# Patient Record
Sex: Female | Born: 1955 | Race: White | Hispanic: No | Smoking: Never smoker
Health system: Southern US, Community
[De-identification: ages and names within clinical notes are randomized; demographics above are authoritative.]

## PROBLEM LIST (undated history)

## (undated) DIAGNOSIS — M1990 Unspecified osteoarthritis, unspecified site: Secondary | ICD-10-CM

## (undated) DIAGNOSIS — J449 Chronic obstructive pulmonary disease, unspecified: Secondary | ICD-10-CM

## (undated) DIAGNOSIS — R112 Nausea with vomiting, unspecified: Secondary | ICD-10-CM

## (undated) DIAGNOSIS — E119 Type 2 diabetes mellitus without complications: Secondary | ICD-10-CM

## (undated) DIAGNOSIS — Z9889 Other specified postprocedural states: Secondary | ICD-10-CM

## (undated) DIAGNOSIS — I499 Cardiac arrhythmia, unspecified: Secondary | ICD-10-CM

## (undated) DIAGNOSIS — D649 Anemia, unspecified: Secondary | ICD-10-CM

## (undated) DIAGNOSIS — K219 Gastro-esophageal reflux disease without esophagitis: Secondary | ICD-10-CM

## (undated) DIAGNOSIS — I10 Essential (primary) hypertension: Secondary | ICD-10-CM

## (undated) DIAGNOSIS — J189 Pneumonia, unspecified organism: Secondary | ICD-10-CM

## (undated) HISTORY — PX: BREAST SURGERY: SHX581

## (undated) HISTORY — PX: ABDOMINAL HYSTERECTOMY: SHX81

## (undated) HISTORY — DX: Type 2 diabetes mellitus without complications: E11.9

## (undated) HISTORY — DX: Essential (primary) hypertension: I10

## (undated) HISTORY — PX: KNEE ARTHROSCOPY: SUR90

## (undated) HISTORY — PX: RIGHT OOPHORECTOMY: SHX2359

## (undated) HISTORY — PX: EYE SURGERY: SHX253

## (undated) HISTORY — PX: TONSILLECTOMY: SUR1361

## (undated) HISTORY — PX: BREAST EXCISIONAL BIOPSY: SUR124

## (undated) HISTORY — PX: CARPAL TUNNEL RELEASE: SHX101

## (undated) HISTORY — PX: CHOLECYSTECTOMY: SHX55

## (undated) HISTORY — PX: BACK SURGERY: SHX140

---

## 2004-06-22 ENCOUNTER — Ambulatory Visit: Payer: Self-pay | Admitting: Internal Medicine

## 2004-08-31 ENCOUNTER — Ambulatory Visit: Payer: Self-pay | Admitting: Family Medicine

## 2005-03-02 ENCOUNTER — Ambulatory Visit: Payer: Self-pay

## 2005-03-08 ENCOUNTER — Ambulatory Visit: Payer: Self-pay

## 2006-01-21 ENCOUNTER — Ambulatory Visit: Payer: Self-pay | Admitting: Family Medicine

## 2006-06-20 ENCOUNTER — Ambulatory Visit (HOSPITAL_BASED_OUTPATIENT_CLINIC_OR_DEPARTMENT_OTHER): Admission: RE | Admit: 2006-06-20 | Discharge: 2006-06-20 | Payer: Self-pay | Admitting: Orthopedic Surgery

## 2006-11-21 ENCOUNTER — Ambulatory Visit: Payer: Self-pay | Admitting: Gastroenterology

## 2007-01-25 ENCOUNTER — Ambulatory Visit: Payer: Self-pay | Admitting: Obstetrics and Gynecology

## 2007-02-02 ENCOUNTER — Ambulatory Visit: Payer: Self-pay | Admitting: Unknown Physician Specialty

## 2007-02-04 ENCOUNTER — Ambulatory Visit: Payer: Self-pay | Admitting: Unknown Physician Specialty

## 2008-06-07 ENCOUNTER — Ambulatory Visit: Payer: Self-pay | Admitting: Family Medicine

## 2008-10-04 ENCOUNTER — Emergency Department: Payer: Self-pay | Admitting: Emergency Medicine

## 2009-04-16 ENCOUNTER — Ambulatory Visit: Payer: Self-pay | Admitting: Family Medicine

## 2010-04-21 ENCOUNTER — Ambulatory Visit: Payer: Self-pay | Admitting: Family Medicine

## 2011-04-27 ENCOUNTER — Ambulatory Visit: Payer: Self-pay | Admitting: Family Medicine

## 2012-05-02 ENCOUNTER — Ambulatory Visit: Payer: Self-pay | Admitting: Family Medicine

## 2012-05-03 ENCOUNTER — Ambulatory Visit: Payer: Self-pay | Admitting: Family Medicine

## 2012-05-18 ENCOUNTER — Ambulatory Visit: Payer: Self-pay | Admitting: Surgery

## 2012-05-22 LAB — PATHOLOGY REPORT

## 2012-08-08 ENCOUNTER — Ambulatory Visit: Payer: Self-pay | Admitting: General Practice

## 2012-09-29 LAB — LIPID PANEL
CHOLESTEROL: 164 mg/dL (ref 0–200)
HDL: 41 mg/dL (ref 35–70)
LDL Cholesterol: 89 mg/dL
LDL/HDL RATIO: 4
Triglycerides: 171 mg/dL — AB (ref 40–160)

## 2012-09-29 LAB — BASIC METABOLIC PANEL
BUN: 9 mg/dL (ref 4–21)
Creatinine: 0.5 mg/dL (ref 0.5–1.1)
GLUCOSE: 239 mg/dL
Potassium: 4.3 mmol/L (ref 3.4–5.3)
SODIUM: 137 mmol/L (ref 137–147)

## 2012-09-29 LAB — HEPATIC FUNCTION PANEL
ALT: 26 U/L (ref 7–35)
AST: 32 U/L (ref 13–35)
Alkaline Phosphatase: 77 U/L (ref 25–125)
Bilirubin, Total: 0.2 mg/dL

## 2012-09-29 LAB — TSH: TSH: 2.91 u[IU]/mL (ref 0.41–5.90)

## 2012-11-23 ENCOUNTER — Ambulatory Visit: Payer: Self-pay | Admitting: Surgery

## 2012-12-06 ENCOUNTER — Ambulatory Visit: Payer: Self-pay | Admitting: Gastroenterology

## 2012-12-07 LAB — PATHOLOGY REPORT

## 2013-05-15 IMAGING — CR US BIOPSY BREAST CORE VACUUM ASSIST
1 series · 1 of 1 positions shown · non-contrast
Comparison: none

REASON FOR EXAM: RT BRST MICROCALS
COMMENTS:

PROCEDURE:     MAM - MAM STEREOTACTIC VACUUM ASSIST R  - May 18, 2012  [DATE]
RESULT:     History: 56 -year-old female referred for stereotactic core
biopsy of a cluster of indeterminate microcalcifications in the right breast
noted on recent mammogram at [HOSPITAL].
TECHNIQUE: Right breast stereotactic vacuum-assisted core biopsy. Unilateral
digital mammography was performed. Specimen radiography of the core
specimens was performed.

[CC]
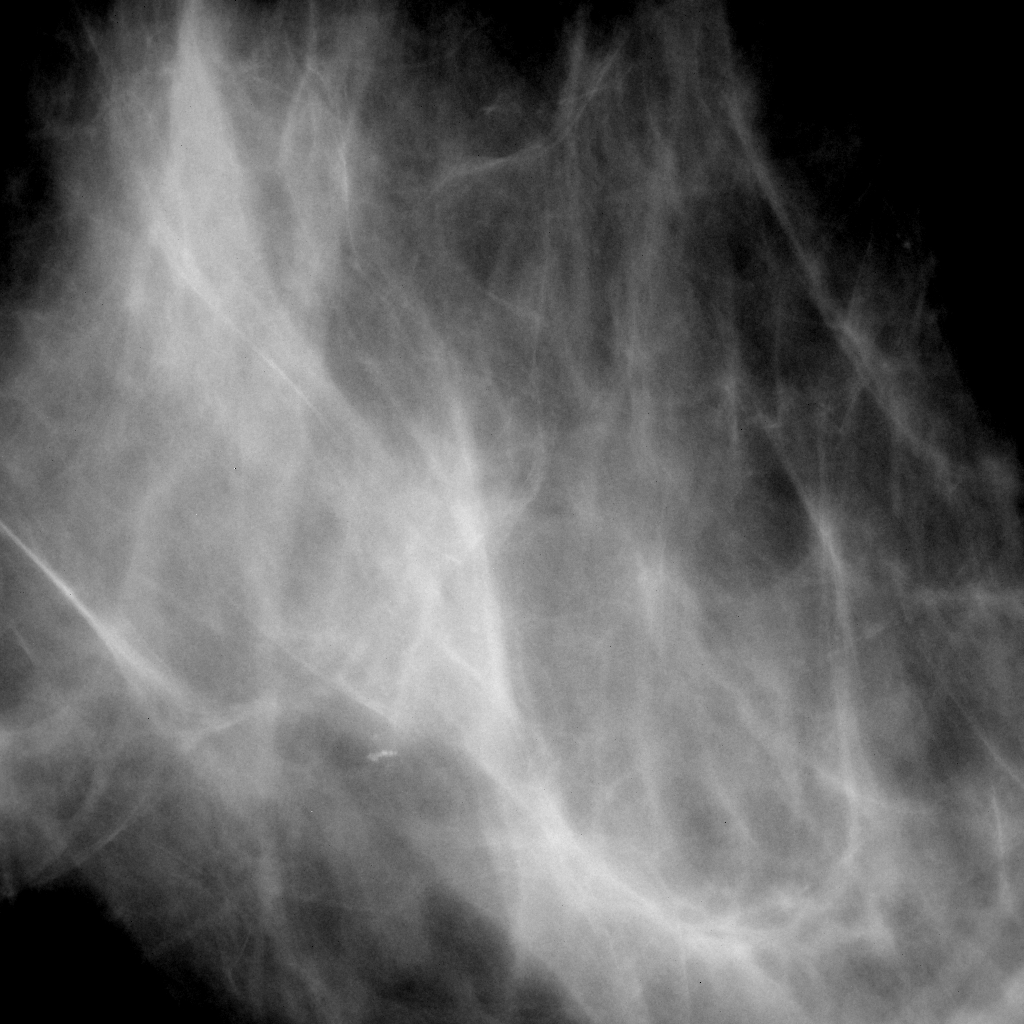

[1 of 1 positions shown; findings below may reference images not displayed]

Procedure:

Preliminary localizing film off the right breast cluster of
microcalcifications was performed.

The risks and benefits of the procedure were discussed. The patient
demonstrated understanding of the procedure and agreed to proceed. A formal
time out procedure was performed according to the departmental protocol.
The patient was positioned prone on the stereotactic biopsy table. The
breast was placed into compression. The target was localized with digital
images. A superior approach was used. The skin was cleansed in the usual
sterile fashion. The overlying skin and deeper subcutaneous tissues were
anesthetized with 1% lidocaine. A small scalpel incision was made with a 10
blade. A 11 gauge vacuum-assisted core biopsy apparatus was inserted and its
position confirmed with pre- and postfiring images. A total of 19 core
specimens were obtained.

The specimen radiograph demonstrated calcifications within the coarse. The
specimens were sent in one container of formalin for pathologic analysis
with results pending. A metallic clip subsequently deployed to mark the site
of biopsy.

Postprocedural craniocaudal and mediolateral projections disclose the clip
to be in appropriate position.

Following the procedure, the wound was cleansed and compressed. Hemostasis
was achieved. Ingebreth and sterile gauze was applied and the patient was
given post biopsy instructions. The patient left the stereotactic biopsy
suite in stable condition.
IMPRESSION: Successful vacuum-assisted stereotactic biopsy of lateral right breast
microcalcifications .

## 2013-09-21 LAB — CBC AND DIFFERENTIAL
HEMATOCRIT: 34 % — AB (ref 36–46)
Hemoglobin: 11.2 g/dL — AB (ref 12.0–16.0)
NEUTROS ABS: 4 /uL
PLATELETS: 237 10*3/uL (ref 150–399)
WBC: 8.7 10^3/mL

## 2013-11-26 ENCOUNTER — Encounter: Payer: Self-pay | Admitting: Otolaryngology

## 2013-12-12 ENCOUNTER — Encounter: Payer: Self-pay | Admitting: Otolaryngology

## 2015-01-29 ENCOUNTER — Other Ambulatory Visit: Payer: Self-pay | Admitting: Orthopedic Surgery

## 2015-01-29 DIAGNOSIS — M25562 Pain in left knee: Secondary | ICD-10-CM

## 2015-02-04 ENCOUNTER — Ambulatory Visit
Admission: RE | Admit: 2015-02-04 | Discharge: 2015-02-04 | Disposition: A | Discharge status: Home / Self Care | Payer: PRIVATE HEALTH INSURANCE | Source: Ambulatory Visit | Attending: Orthopedic Surgery | Admitting: Orthopedic Surgery

## 2015-02-04 DIAGNOSIS — M1712 Unilateral primary osteoarthritis, left knee: Secondary | ICD-10-CM | POA: Diagnosis not present

## 2015-02-04 DIAGNOSIS — M25562 Pain in left knee: Secondary | ICD-10-CM

## 2015-02-04 DIAGNOSIS — M7122 Synovial cyst of popliteal space [Baker], left knee: Secondary | ICD-10-CM | POA: Diagnosis not present

## 2015-02-04 DIAGNOSIS — M25462 Effusion, left knee: Secondary | ICD-10-CM | POA: Insufficient documentation

## 2015-04-04 ENCOUNTER — Other Ambulatory Visit: Payer: Self-pay

## 2015-04-04 MED ORDER — GLUCOSE BLOOD VI STRP: ORAL_STRIP | Status: AC

## 2015-04-16 ENCOUNTER — Other Ambulatory Visit: Payer: Self-pay

## 2015-04-16 DIAGNOSIS — E119 Type 2 diabetes mellitus without complications: Secondary | ICD-10-CM

## 2015-04-16 MED ORDER — GLUCOSE BLOOD VI STRP: ORAL_STRIP | Status: DC

## 2015-04-23 ENCOUNTER — Ambulatory Visit (INDEPENDENT_AMBULATORY_CARE_PROVIDER_SITE_OTHER): Payer: PRIVATE HEALTH INSURANCE | Admitting: Family Medicine

## 2015-04-23 ENCOUNTER — Encounter: Payer: Self-pay | Admitting: Family Medicine

## 2015-04-23 VITALS — BP 142/74 | HR 76 | Temp 98.0°F | Resp 16 | Wt 224.0 lb

## 2015-04-23 DIAGNOSIS — E782 Mixed hyperlipidemia: Secondary | ICD-10-CM | POA: Insufficient documentation

## 2015-04-23 DIAGNOSIS — I499 Cardiac arrhythmia, unspecified: Secondary | ICD-10-CM | POA: Insufficient documentation

## 2015-04-23 DIAGNOSIS — G5600 Carpal tunnel syndrome, unspecified upper limb: Secondary | ICD-10-CM | POA: Insufficient documentation

## 2015-04-23 DIAGNOSIS — S83209A Unspecified tear of unspecified meniscus, current injury, unspecified knee, initial encounter: Secondary | ICD-10-CM | POA: Insufficient documentation

## 2015-04-23 DIAGNOSIS — F411 Generalized anxiety disorder: Secondary | ICD-10-CM | POA: Insufficient documentation

## 2015-04-23 DIAGNOSIS — G4710 Hypersomnia, unspecified: Secondary | ICD-10-CM | POA: Insufficient documentation

## 2015-04-23 DIAGNOSIS — M1990 Unspecified osteoarthritis, unspecified site: Secondary | ICD-10-CM | POA: Insufficient documentation

## 2015-04-23 DIAGNOSIS — H8190 Unspecified disorder of vestibular function, unspecified ear: Secondary | ICD-10-CM | POA: Insufficient documentation

## 2015-04-23 DIAGNOSIS — IMO0002 Reserved for concepts with insufficient information to code with codable children: Secondary | ICD-10-CM | POA: Insufficient documentation

## 2015-04-23 DIAGNOSIS — Z6830 Body mass index (BMI) 30.0-30.9, adult: Secondary | ICD-10-CM | POA: Insufficient documentation

## 2015-04-23 DIAGNOSIS — Z01818 Encounter for other preprocedural examination: Secondary | ICD-10-CM | POA: Diagnosis not present

## 2015-04-23 DIAGNOSIS — M797 Fibromyalgia: Secondary | ICD-10-CM | POA: Insufficient documentation

## 2015-04-23 DIAGNOSIS — I10 Essential (primary) hypertension: Secondary | ICD-10-CM | POA: Insufficient documentation

## 2015-04-23 DIAGNOSIS — E119 Type 2 diabetes mellitus without complications: Secondary | ICD-10-CM | POA: Insufficient documentation

## 2015-04-23 DIAGNOSIS — M069 Rheumatoid arthritis, unspecified: Secondary | ICD-10-CM | POA: Insufficient documentation

## 2015-04-23 DIAGNOSIS — E785 Hyperlipidemia, unspecified: Secondary | ICD-10-CM | POA: Insufficient documentation

## 2015-04-23 DIAGNOSIS — G4730 Sleep apnea, unspecified: Secondary | ICD-10-CM

## 2015-04-23 DIAGNOSIS — R030 Elevated blood-pressure reading, without diagnosis of hypertension: Secondary | ICD-10-CM

## 2015-04-23 DIAGNOSIS — L0390 Cellulitis, unspecified: Secondary | ICD-10-CM | POA: Insufficient documentation

## 2015-04-23 DIAGNOSIS — IMO0001 Reserved for inherently not codable concepts without codable children: Secondary | ICD-10-CM | POA: Insufficient documentation

## 2015-04-23 DIAGNOSIS — J45909 Unspecified asthma, uncomplicated: Secondary | ICD-10-CM | POA: Insufficient documentation

## 2015-04-23 DIAGNOSIS — K219 Gastro-esophageal reflux disease without esophagitis: Secondary | ICD-10-CM | POA: Insufficient documentation

## 2015-04-23 DIAGNOSIS — F330 Major depressive disorder, recurrent, mild: Secondary | ICD-10-CM | POA: Insufficient documentation

## 2015-04-23 DIAGNOSIS — J42 Unspecified chronic bronchitis: Secondary | ICD-10-CM | POA: Insufficient documentation

## 2015-04-23 LAB — POCT GLYCOSYLATED HEMOGLOBIN (HGB A1C): Hemoglobin A1C: 8.1

## 2015-04-23 NOTE — Progress Notes (Signed)
Patient ID: Sarah Carson, female   DOB: 16-May-1956, 59 y.o.   MRN: [id number]   Sarah Carson  MRN: [id number] DOB: [birth year]  Subjective:  HPI  1. Pre-op exam Patient is a 59 year old female who presents for pre-operative exam.  She is planning to have arthroscopic surgery of the left knee for torn meniscus.  Her surgeon his Dr. Salvatore Marvel.  Her after surgery care will be provided at home with family.  She denies any adverse reaction to anesthesia.  She is a non drinker and non smoker.  She has history of asthma and history of cardiac dysrhythmia.  She is currently under increased stress due to the surgery and her worries over the care of her husband while she is recovering.   Patient Active Problem List   Diagnosis Date Noted  . 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 Cardiac dysrhythmia 04/23/2015  . Arthritis 04/23/2015  . Airway hyperreactivity 04/23/2015  . Carpal tunnel syndrome 04/23/2015  . Cellulitis 04/23/2015  . Bronchitis, chronic 04/23/2015  . Diabetes mellitus, type 2 04/23/2015  . Blood pressure elevated 04/23/2015  . Essential (primary) hypertension 04/23/2015  . Fibrositis 04/23/2015  . Anxiety, generalized 04/23/2015  . Gastro-esophageal reflux disease without esophagitis 04/23/2015  . Herniation of nucleus pulposus 04/23/2015  . HLD (hyperlipidemia) 04/23/2015  . Hypersomnia with sleep apnea 04/23/2015  . Depression, major, recurrent, mild 04/23/2015  . Adult BMI 30+ 04/23/2015  . Arthritis or polyarthritis, rheumatoid 04/23/2015  . Knee torn cartilage 04/23/2015    No past medical history on file.  Social History   Social History  . Marital Status: Married    Spouse Name: [name]  . Number of Children: 2  . Years of Education: college   Occupational History  . office manager    Social History Main Topics  . Smoking status: Never Smoker   . Smokeless tobacco: Not on file  . Alcohol Use: No  . Drug Use: No  . Sexual Activity: Not on file   Other  Topics Concern  . Not on file   Social History Narrative    Outpatient Prescriptions Prior to Visit  Medication Sig Dispense Refill  . glucose blood (ACCU-CHEK COMPACT STRIPS) test strip Check sugar once daily-need QS 30 day supply. DX E11.9 100 each 12  . glucose blood (ONETOUCH VERIO) test strip Use as instructed 100 each 12   No facility-administered medications prior to visit.    Allergies  Allergen Reactions  . Levalbuterol   . Liraglutide   . Oxycodone-Acetaminophen   . Propoxyphene   . Rofecoxib Other (See Comments)    Review of Systems  Constitutional: Negative.   HENT: Negative.   Eyes: Negative.   Respiratory: Positive for cough. Negative for hemoptysis, sputum production, shortness of breath and wheezing.   Cardiovascular: Negative.   Gastrointestinal: Negative.   Musculoskeletal: Positive for joint pain (knee-left). Negative for myalgias, back pain, falls and neck pain.  Skin: Negative.   Neurological: Negative.   Endo/Heme/Allergies: Positive for environmental allergies. Negative for polydipsia. Does not bruise/bleed easily.  Psychiatric/Behavioral: Negative for depression, suicidal ideas, hallucinations, memory loss and substance abuse. The patient is nervous/anxious and has insomnia.    Objective:  BP 142/74 mmHg  Pulse 76  Temp(Src) 98 F (36.7 C) (Oral)  Resp 16  Wt 224 lb (101.606 kg)  Physical Exam  Constitutional: She is oriented to person, place, and time and well-developed, well-nourished, and in no distress.  HENT:  Head: Normocephalic and atraumatic.  Right  Ear: External ear normal.  Left Ear: External ear normal.  Nose: Nose normal.  Neck: Neck supple.  Cardiovascular: Normal rate, regular rhythm and normal heart sounds.   Pulmonary/Chest: Effort normal and breath sounds normal.  Abdominal: Soft.  Neurological: She is alert and oriented to person, place, and time. Gait normal.  Skin: Skin is warm and dry.  Psychiatric: Mood, memory,  affect and judgment normal.    Assessment and Plan :  Pre-op exam  Pt cleared for surgery. TII DM A1C is 8.1 today.  D and E stressed . Obesity Social Pt concerned as her husband has progressive dementia and she is worried about her ability to safely care for him going forward. I have done the exam and reviewed the above chart and it is accurate to the best of my knowledge.  Julieanne Manson MD [facility] 04/23/2015 4:02 PM

## 2015-04-29 ENCOUNTER — Ambulatory Visit: Payer: PRIVATE HEALTH INSURANCE | Admitting: Podiatry

## 2015-06-03 ENCOUNTER — Other Ambulatory Visit: Payer: Self-pay

## 2015-06-03 MED ORDER — SITAGLIPTIN PHOS-METFORMIN HCL 50-1000 MG PO TABS: 1.0000 | ORAL_TABLET | Freq: Two times a day (BID) | ORAL | Status: DC

## 2015-06-03 MED ORDER — OMEPRAZOLE 40 MG PO CPDR: 40.0000 mg | DELAYED_RELEASE_CAPSULE | Freq: Every day | ORAL | Status: DC

## 2015-06-03 MED ORDER — VERAPAMIL HCL ER 300 MG PO CP24
300.0000 mg | ORAL_CAPSULE | Freq: Every day | ORAL | Status: DC
Start: 2015-06-03 — End: 2016-05-03

## 2015-06-03 MED ORDER — ESCITALOPRAM OXALATE 20 MG PO TABS: 20.0000 mg | ORAL_TABLET | Freq: Every day | ORAL | Status: DC

## 2015-06-17 ENCOUNTER — Ambulatory Visit
Admission: RE | Admit: 2015-06-17 | Discharge: 2015-06-17 | Disposition: A | Discharge status: Home / Self Care | Payer: PRIVATE HEALTH INSURANCE | Source: Ambulatory Visit | Attending: Family Medicine | Admitting: Family Medicine

## 2015-06-17 ENCOUNTER — Ambulatory Visit (INDEPENDENT_AMBULATORY_CARE_PROVIDER_SITE_OTHER): Payer: PRIVATE HEALTH INSURANCE | Admitting: Family Medicine

## 2015-06-17 ENCOUNTER — Telehealth: Payer: Self-pay

## 2015-06-17 VITALS — BP 130/60 | HR 88 | Temp 98.1°F | Resp 18 | Wt 221.0 lb

## 2015-06-17 DIAGNOSIS — R05 Cough: Secondary | ICD-10-CM

## 2015-06-17 DIAGNOSIS — R059 Cough, unspecified: Secondary | ICD-10-CM

## 2015-06-17 MED ORDER — CLARITHROMYCIN 500 MG PO TABS: 500.0000 mg | ORAL_TABLET | Freq: Two times a day (BID) | ORAL | Status: DC

## 2015-06-17 MED ORDER — HYDROCOD POLST-CPM POLST ER 10-8 MG/5ML PO SUER: 5.0000 mL | Freq: Two times a day (BID) | ORAL | Status: DC | PRN

## 2015-06-17 NOTE — Telephone Encounter (Signed)
Pt concern for coughing not getting better since 1 - 2 weeks ago. Pt stated that she has seen employee health and that they has prescribed her a Zpak and steroids. She stated that she did coughed up blood tinged sputum at the beginning but now it is just yellow sputum. MD Sullivan Lone notified and he stated that he could see her either this am of this afternoon. Scheduled pt for OV for today at 03:15 pm.  Thanks,

## 2015-06-17 NOTE — Progress Notes (Signed)
Patient ID: Sarah Carson, female   DOB: [birth year], 59 y.o.   MRN: [id number]   MUSLIMA TOPPINS  MRN: [id number] DOB: [birth year]  Subjective:  HPI   1. Cough Patient is a 59 year old female who presents today for evaluation of persistent cough.  She began with this cough 3 weeks ago.  She described the cough as a productive cough that continues until she is unable to catch her breath.  She states she has gotten to the point of near syncope and near emesis.  She coughs up yellow sputum now.  At the start of her illness she was coughing up some blood along with the sputum.  The patient states she seriously thinks she has pertussis.  She feels she has had a low grade fever for about 1 week.  She does say that her 11 year old niece has had similar symptoms for 3 weeks also.  Patient was seen by her employee health on 06/04/15 and was treated with Z-Pak, Prednisone taper, Flonase, and Cherry  Tussin.    Patient Active Problem List   Diagnosis Date Noted  . 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 Cardiac dysrhythmia 04/23/2015  . Arthritis 04/23/2015  . Airway hyperreactivity 04/23/2015  . Carpal tunnel syndrome 04/23/2015  . Cellulitis 04/23/2015  . Bronchitis, chronic (HCC) 04/23/2015  . Diabetes mellitus, type 2 (HCC) 04/23/2015  . Blood pressure elevated 04/23/2015  . Essential (primary) hypertension 04/23/2015  . Fibrositis 04/23/2015  . Anxiety, generalized 04/23/2015  . Gastro-esophageal reflux disease without esophagitis 04/23/2015  . Herniation of nucleus pulposus 04/23/2015  . HLD (hyperlipidemia) 04/23/2015  . Hypersomnia with sleep apnea 04/23/2015  . 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 Adult BMI 30+ 04/23/2015  . 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 Knee torn cartilage 04/23/2015    No past medical history on file.  Social History   Social History  . Marital Status: Married    Spouse Name: [name]  . Number of Children: 2  . Years of  Education: college   Occupational History  . office manager    Social History Main Topics  . Smoking status: Never Smoker   . Smokeless tobacco: Not on file  . Alcohol Use: No  . Drug Use: No  . Sexual Activity: Not on file   Other Topics Concern  . Not on file   Social History Narrative    Outpatient Prescriptions Prior to Visit  Medication Sig Dispense Refill  . escitalopram (LEXAPRO) 20 MG tablet Take 1 tablet (20 mg total) by mouth daily. 90 tablet 3  . glucose blood (ACCU-CHEK COMPACT STRIPS) test strip Check sugar once daily-need QS 30 day supply. DX E11.9 100 each 12  . glucose blood (ONETOUCH VERIO) test strip Use as instructed 100 each 12  . Hydrocodone-Acetaminophen 5-300 MG TABS Take by mouth.    . meclizine (ANTIVERT) 25 MG tablet Take by mouth.    Marland Kitchen omeprazole (PRILOSEC) 40 MG capsule Take 1 capsule (40 mg total) by mouth daily. 90 capsule 3  . sitaGLIPtin-metformin (JANUMET) 50-1000 MG per tablet Take 1 tablet by mouth 2 (two) times daily with a meal. 180 tablet 3  . Verapamil HCl CR 300 MG CP24 Take 1 capsule (300 mg total) by mouth daily. 90 each 3   No facility-administered medications prior to visit.    Allergies  Allergen Reactions  . Levalbuterol   . Liraglutide   . Oxycodone-Acetaminophen   . Propoxyphene   . Rofecoxib Other (  See Comments)    Review of Systems  Constitutional: Positive for fever, chills, malaise/fatigue and diaphoresis.  HENT: Positive for congestion and tinnitus. Negative for ear discharge, ear pain, hearing loss, nosebleeds and sore throat.   Eyes: Negative for pain, discharge and redness.  Respiratory: Positive for cough, hemoptysis, sputum production, shortness of breath, wheezing and stridor.   Cardiovascular: Positive for orthopnea. Negative for chest pain, palpitations and leg swelling.  Gastrointestinal: Positive for heartburn. Negative for nausea, vomiting, abdominal pain, diarrhea, constipation, blood in stool and melena.   Genitourinary:       Patient admits to having to wear a diaper since this has started.  She also note a foul odor to the urine.  Musculoskeletal: Negative for myalgias, back pain, joint pain and neck pain.  Neurological: Positive for dizziness (When she coughs she gets to coughing so much that she feels she is going to pass out.), weakness and headaches.  Psychiatric/Behavioral: Negative.    Objective:  BP 130/60 mmHg  Pulse 88  Temp(Src) 98.1 F (36.7 C) (Oral)  Resp 18  Wt 221 lb (100.245 kg)  SpO2 96%  Physical Exam  Constitutional: She is oriented to person, place, and time and well-developed, well-nourished, and in no distress.  HENT:  Head: Normocephalic and atraumatic.  Right Ear: External ear normal.  Left Ear: External ear normal.  Nose: Nose normal.  Mouth/Throat: Oropharynx is clear and moist. No oropharyngeal exudate.  Eyes: Conjunctivae are normal.  Neck: Neck supple.  Cardiovascular: Normal rate, regular rhythm and normal heart sounds.   Pulmonary/Chest: Effort normal and breath sounds normal.  Abdominal: Soft.  Neurological: She is alert and oriented to person, place, and time. Gait normal.  Skin: Skin is warm and dry.  Psychiatric: Mood, memory, affect and judgment normal.    Assessment and Plan :  Cough  Tussionex 5 cc every 12 hours when necessary cough  Bronchitis I do not think this is pertussis. We'll obtain chest x-ray and treat with Biaxin 500 mg twice a day for a week. Consider treated with that with Augmentin if this does not work. Julieanne Manson MD [facility] 06/17/2015 3:29 PM

## 2015-06-18 ENCOUNTER — Telehealth: Payer: Self-pay

## 2015-06-18 NOTE — Telephone Encounter (Signed)
Advised  ED 

## 2015-06-18 NOTE — Telephone Encounter (Signed)
-----   Message from Maple Hudson., MD sent at 06/18/2015 11:18 AM EDT -----  CXR ok,,comes in today with husband.

## 2015-06-20 ENCOUNTER — Encounter: Payer: Self-pay | Admitting: Family Medicine

## 2015-06-25 ENCOUNTER — Telehealth: Payer: Self-pay | Admitting: Family Medicine

## 2015-06-25 MED ORDER — AMOXICILLIN-POT CLAVULANATE 875-125 MG PO TABS: 1.0000 | ORAL_TABLET | Freq: Two times a day (BID) | ORAL | Status: DC

## 2015-06-25 NOTE — Telephone Encounter (Signed)
Augmentin 875 BID for 1 week if not PCN allergic then refer to Pulmonary if not improving.

## 2015-06-25 NOTE — Telephone Encounter (Signed)
Please below-aa 

## 2015-06-25 NOTE — Telephone Encounter (Signed)
Pt stated she took the last dose of her second round of antibiotics this morning and is still coughing up yellow and green sputum. Pharmacy: [facility]. Please advise. Thanks TNP

## 2015-06-25 NOTE — Telephone Encounter (Signed)
Pt advised, RX sent in-aa 

## 2015-06-30 ENCOUNTER — Telehealth: Payer: Self-pay | Admitting: Family Medicine

## 2015-06-30 DIAGNOSIS — J40 Bronchitis, not specified as acute or chronic: Secondary | ICD-10-CM

## 2015-06-30 NOTE — Telephone Encounter (Signed)
Pt stated that she was advised to call back if she wasn't getting better. Pt stated she is getting worse and thinks it is time to go with Dr. Elisabeth Cara advise to see a Pulmonologist. Please advise. Thanks TNP

## 2015-06-30 NOTE — Telephone Encounter (Signed)
Refer to pulmonology. Chronic bronchitis/chronic cough

## 2015-06-30 NOTE — Telephone Encounter (Signed)
Advised and order put in chart.

## 2015-06-30 NOTE — Telephone Encounter (Signed)
Ok to refer to Pulmonology?

## 2015-08-26 ENCOUNTER — Institutional Professional Consult (permissible substitution): Payer: PRIVATE HEALTH INSURANCE | Admitting: Pulmonary Disease

## 2015-08-27 ENCOUNTER — Ambulatory Visit (INDEPENDENT_AMBULATORY_CARE_PROVIDER_SITE_OTHER): Payer: PRIVATE HEALTH INSURANCE | Admitting: Family Medicine

## 2015-08-27 ENCOUNTER — Encounter: Payer: Self-pay | Admitting: Family Medicine

## 2015-08-27 VITALS — BP 124/72 | HR 72 | Resp 16 | Ht 65.0 in | Wt 221.0 lb

## 2015-08-27 DIAGNOSIS — I10 Essential (primary) hypertension: Secondary | ICD-10-CM | POA: Diagnosis not present

## 2015-08-27 DIAGNOSIS — S83207D Unspecified tear of unspecified meniscus, current injury, left knee, subsequent encounter: Secondary | ICD-10-CM | POA: Diagnosis not present

## 2015-08-27 DIAGNOSIS — F330 Major depressive disorder, recurrent, mild: Secondary | ICD-10-CM | POA: Diagnosis not present

## 2015-08-27 DIAGNOSIS — L309 Dermatitis, unspecified: Secondary | ICD-10-CM

## 2015-08-27 DIAGNOSIS — E119 Type 2 diabetes mellitus without complications: Secondary | ICD-10-CM | POA: Diagnosis not present

## 2015-08-27 LAB — POCT GLYCOSYLATED HEMOGLOBIN (HGB A1C)
ESTIMATED AVERAGE GLUCOSE: 189
Hemoglobin A1C: 8.2

## 2015-08-27 MED ORDER — KETOROLAC TROMETHAMINE 60 MG/2ML IM SOLN
60.0000 mg | Freq: Once | INTRAMUSCULAR | Status: AC
  Administered 2015-08-27: 60 mg via INTRAMUSCULAR

## 2015-08-27 MED ORDER — MOMETASONE FUROATE 0.1 % EX CREA
1.0000 | TOPICAL_CREAM | Freq: Every day | CUTANEOUS | Status: DC
Start: 2015-08-27 — End: 2016-04-01

## 2015-08-27 MED ORDER — DULOXETINE HCL 20 MG PO CPEP: 20.0000 mg | ORAL_CAPSULE | Freq: Every day | ORAL | Status: DC

## 2015-08-27 NOTE — Progress Notes (Signed)
Patient ID: Sarah Carson, female   DOB: [birth year], 58 y.o.   MRN: [id number]       [name] Female    DOB: November 11, 1955   59 y.o.   MRN: [id number] Visit Date: 08/27/2015  Today's Provider: [name], MD   Chief Complaint  Patient presents with  . Diabetes    4 month F/U.   Marland Kitchen Hypertension  . Depression   Subjective:    Hypertension This is a chronic problem. The problem is unchanged. The problem is controlled. Pertinent negatives include no chest pain, headaches, palpitations, peripheral edema or shortness of breath. The current treatment provides significant improvement. There are no compliance problems.   Depression        This is a chronic problem.The problem is unchanged.  Associated symptoms include fatigue, decreased interest and body aches.  Associated symptoms include no headaches and no suicidal ideas.  Compliance with treatment is good.  Previous treatment provided moderate relief.   Diabetes Mellitus Type II, Follow-up:   Lab Results  Component Value Date   HGBA1C 8.2 08/27/2015   HGBA1C 8.1 04/23/2015    Last seen for diabetes 4 months ago.  Management since then includes no changes. She reports fair compliance with treatment. She is not having side effects.  Home blood sugar records: not being checked at home.   Episodes of hypoglycemia? no   Current Insulin Regimen: none Most Recent Eye Exam: more than 1 yr ago Weight trend: stable Prior visit with dietician: no Current diet: in general, a "healthy" diet   Current exercise: none due to recent knee surgery.   Pertinent Labs: No results found for: CHOL, TRIG, HDL, LDLCALC, CREATININE  Wt Readings from Last 3 Encounters:  08/27/15 221 lb (100.245 kg)  06/17/15 221 lb (100.245 kg)  04/23/15 224 lb (101.606 kg)    ------------------------------------------------------------------------       Allergies  Allergen Reactions  . Levalbuterol   . Liraglutide   .  Oxycodone-Acetaminophen   . Propoxyphene   . Rofecoxib Other (See Comments)   Previous Medications   AMOXICILLIN-CLAVULANATE (AUGMENTIN) 875-125 MG TABLET    Take 1 tablet by mouth 2 (two) times daily.   CHLORPHENIRAMINE-HYDROCODONE (TUSSIONEX PENNKINETIC ER) 10-8 MG/5ML SUER    Take 5 mLs by mouth every 12 (twelve) hours as needed for cough.   CLARITHROMYCIN (BIAXIN) 500 MG TABLET    Take 1 tablet (500 mg total) by mouth 2 (two) times daily.   ESCITALOPRAM (LEXAPRO) 20 MG TABLET    Take 1 tablet (20 mg total) by mouth daily.   GLUCOSE BLOOD (ACCU-CHEK COMPACT STRIPS) TEST STRIP    Check sugar once daily-need QS 30 day supply. DX E11.9   GLUCOSE BLOOD (ONETOUCH VERIO) TEST STRIP    Use as instructed   HYDROCODONE-ACETAMINOPHEN 5-300 MG TABS    Take by mouth. Reported on 08/27/2015   MECLIZINE (ANTIVERT) 25 MG TABLET    Take by mouth.   OMEPRAZOLE (PRILOSEC) 40 MG CAPSULE    Take 1 capsule (40 mg total) by mouth daily.   SITAGLIPTIN-METFORMIN (JANUMET) 50-1000 MG PER TABLET    Take 1 tablet by mouth 2 (two) times daily with a meal.   VERAPAMIL HCL CR 300 MG CP24    Take 1 capsule (300 mg total) by mouth daily.    Review of Systems  Constitutional: Positive for fatigue.  Eyes: Negative.   Respiratory: Negative for shortness of breath.   Cardiovascular: Negative for chest pain and palpitations.  Endocrine: Negative.   Allergic/Immunologic: Negative.   Neurological: Negative for headaches.  Hematological: Negative.   Psychiatric/Behavioral: Positive for depression. Negative for suicidal ideas.    Social History  Substance Use Topics  . Smoking status: Never Smoker   . Smokeless tobacco: Not on file  . Alcohol Use: No   Objective:   BP 124/72 mmHg  Pulse 72  Resp 16  Ht 5\' 5"  (1.651 m)  Wt 221 lb (100.245 kg)  BMI 36.78 kg/m2  Physical Exam  Constitutional: She is oriented to person, place, and time. She appears well-developed and well-nourished.  Eyes: Conjunctivae are  normal.  Neck: Neck supple.  Cardiovascular: Normal rate, regular rhythm, normal heart sounds and intact distal pulses.   Pulmonary/Chest: Effort normal and breath sounds normal.  Abdominal: Soft.  Neurological: She is alert and oriented to person, place, and time. She has normal reflexes.  Skin: Skin is warm and dry.  Psychiatric: She has a normal mood and affect. Her behavior is normal. Judgment and thought content normal.  Nursing note and vitals reviewed.       Assessment & Plan:     1. Essential (primary) hypertension Stable. Continue current meds.   2. Type 2 diabetes mellitus without complication, without long-term current use of insulin (HCC) Not to goal. Instructed patient to incorporate healthy diet and exercise as much as she can.  - POCT HgB A1C--8.2 today.  3. Depression, major, recurrent, mild (HCC) Worsening secondary to chronic knee pain. Discontinue Lexapro and start Cymbalta as below. Patient will F/U in 6 weeks after starting Cymbalta.  - DULoxetine (CYMBALTA) 20 MG capsule; Take 1 capsule (20 mg total) by mouth daily.  Dispense: 30 capsule; Refill: 3 Husband with progressive early dementia. 4. Dermatitis New problem. Treat with elocon cream as below. Patient will call if not improving.  - mometasone (ELOCON) 0.1 % cream; Apply 1 application topically daily.  Dispense: 30 g; Refill: 0  5. Knee torn cartilage, left, subsequent encounter Worsening. Patient rates knee pain a 6 on pain scale of 1-10. Injection given in the office. Patient tolerated well without difficulty.  - ketorolac (TORADOL) injection 60 mg; Inject 2 mLs (60 mg total) into the muscle once.        Richard , MD  [facility]

## 2015-08-28 ENCOUNTER — Telehealth: Payer: Self-pay | Admitting: Family Medicine

## 2015-08-28 NOTE — Telephone Encounter (Signed)
Any recommendations? Please advise. Thanks!  

## 2015-08-28 NOTE — Telephone Encounter (Signed)
Pt was in yesterday and got a shot of Toradol for knee pain.  It really helped the knee pain but ;last night she starting having lower back and and pains.  It subsided but she is still feeling sore in her lower back.  Also stated that she was very thirsty.  Pt    Call back is [phone]  Thanks Barth Kirks

## 2015-09-02 NOTE — Telephone Encounter (Signed)
Pt returned call. Thanks TNP °

## 2015-09-02 NOTE — Telephone Encounter (Signed)
toradol should not cause that type side effect.

## 2015-09-02 NOTE — Telephone Encounter (Signed)
Lmtcb, was going to see how patient is doing now since this message was taking last week-aa

## 2015-09-02 NOTE — Telephone Encounter (Signed)
Left message to call back  

## 2015-09-03 NOTE — Telephone Encounter (Signed)
Spoke with patient, she is better now, she looked up on Internet and the symptoms she was having was a rare reaction and she had it. I added this to her chart. She is fine now but it took her 3 days to feel better, severe cramping in her lower back and abdomen, vomiting.

## 2015-10-08 ENCOUNTER — Ambulatory Visit: Payer: PRIVATE HEALTH INSURANCE | Admitting: Family Medicine

## 2015-10-13 ENCOUNTER — Ambulatory Visit: Payer: Self-pay | Admitting: Physician Assistant

## 2015-10-13 ENCOUNTER — Encounter: Payer: Self-pay | Admitting: Physician Assistant

## 2015-10-13 VITALS — BP 140/70 | HR 99 | Temp 97.8°F

## 2015-10-13 DIAGNOSIS — B9789 Other viral agents as the cause of diseases classified elsewhere: Secondary | ICD-10-CM

## 2015-10-13 DIAGNOSIS — J028 Acute pharyngitis due to other specified organisms: Secondary | ICD-10-CM

## 2015-10-13 DIAGNOSIS — M5412 Radiculopathy, cervical region: Secondary | ICD-10-CM

## 2015-10-13 DIAGNOSIS — J029 Acute pharyngitis, unspecified: Secondary | ICD-10-CM

## 2015-10-13 MED ORDER — PREDNISONE 10 MG (21) PO TBPK: 10.0000 mg | ORAL_TABLET | Freq: Every day | ORAL | Status: DC

## 2015-10-13 NOTE — Progress Notes (Signed)
S: c/o left shoulder and arm pain, has knots in upper arm, pain radiates to fingers, also sore throat, no fever/chills, no cough or congestion  O: vitals wnl, nad, throat red at uvula only, lungs c t a, cv rrr, cspine and shoulder neg for bony or muscular tenderness, left humerus area tender, + several knots in soft tissue of arm, no redness or swelling noted, n/v intact  A: left arm pain, uvulitis  P: sterapred ds 10mg  6 d dose pack, f/u with ortho, if ortho doesn't think the pain is orthopedic related then will refer to Dr to eval

## 2015-10-30 LAB — HM DIABETES EYE EXAM

## 2015-11-25 DIAGNOSIS — M5412 Radiculopathy, cervical region: Secondary | ICD-10-CM | POA: Insufficient documentation

## 2015-11-27 ENCOUNTER — Encounter: Payer: Self-pay | Admitting: Physician Assistant

## 2015-11-27 ENCOUNTER — Ambulatory Visit: Payer: Self-pay | Admitting: Physician Assistant

## 2015-11-27 ENCOUNTER — Telehealth: Payer: Self-pay | Admitting: Family Medicine

## 2015-11-27 VITALS — BP 150/70 | HR 90 | Temp 98.2°F

## 2015-11-27 DIAGNOSIS — T7840XA Allergy, unspecified, initial encounter: Secondary | ICD-10-CM

## 2015-11-27 MED ORDER — OXYCODONE HCL 5 MG PO TABS: 5.0000 mg | ORAL_TABLET | ORAL | Status: DC | PRN

## 2015-11-27 NOTE — Telephone Encounter (Signed)
Please review-aa 

## 2015-11-27 NOTE — Telephone Encounter (Signed)
Pt would like Ana to call her back. Pt stated that after going to the [facility] she realized that she is allergic to Hydrocodone-Acetaminophen 5-300 MG TABS. Pt stated she has been getting that RX from a doctor in [place]. Pt stated that she was advised to get an RX for Oxycodone and she shouldn't have a reaction to that medication. Pt would like Dr. Sullivan Lone to write that RX b/c she doesn't thinks she could drive to [facility] b/c she is in so much pain. I advised that I wasn't sure if we could do that since we weren't the provider that wrote the RX for Hydrocodone. Pt stated Dr. Toma Deiters. Please advise. Thanks TNP

## 2015-11-27 NOTE — Telephone Encounter (Signed)
Oxycodone 5mg  q 4 hrs prn,#100. See me 1-3 weeks.

## 2015-11-27 NOTE — Telephone Encounter (Signed)
Pt advised, RX placed up front, pt will check with surgeon and see if they want [place] to follow or they will follow for the medication, will need to see patient for any more refills if we are following patient. Patient Sarah Carson

## 2015-11-27 NOTE — Telephone Encounter (Signed)
Pt calling requesting any and all medication's she is allergic to and what was the last shot pt received. Pt states if she doesn't answer her phone that is ok to leave message on her cell phone.  Thanks CC

## 2015-11-27 NOTE — Telephone Encounter (Signed)
Pt advised-aa 

## 2015-11-27 NOTE — Progress Notes (Signed)
S: pt states has rash that comes and goes like hives and is itching, taking prednisone but has been cutting down on it for next steroid injection in neck, states is allergic to vicodin, took oxycodone last year for knee pain and did ok on that medicine, when discussing her current meds pt states she is taking hydrocodone, did not know that hydrocodone is vicodin, denies cp/sob, has had wheezing at night  O: vitals wnl, lungs c t a, cv rrr, faint hives on abd/trunk area, n/v intact  A: allergic reaction to hydrocodone  P: told pt to call physician that is prescribing pain medicine for her, let them know she is having an allergic reaction to the vicodin, also ?if its an error that oxycodone is listed as allergy since was able to take it last year without difficulty, told pt to stop the medication and continue steroid + antihistamine to help decrease itching and hives, steroid may have been decreasing the reaction

## 2015-12-10 ENCOUNTER — Telehealth: Payer: Self-pay | Admitting: Family Medicine

## 2015-12-10 DIAGNOSIS — E119 Type 2 diabetes mellitus without complications: Secondary | ICD-10-CM

## 2015-12-10 MED ORDER — PIOGLITAZONE HCL 15 MG PO TABS: 15.0000 mg | ORAL_TABLET | Freq: Every day | ORAL | Status: DC

## 2015-12-10 NOTE — Telephone Encounter (Signed)
Pt advised as directed below.  RX sent to [facility].   Thanks,   -Vernona Rieger

## 2015-12-10 NOTE — Telephone Encounter (Signed)
Pt reports she had an steroid injection yesterday; last night she did not check her blood sugar but she knows it was very high.  Today when she checked it her sugar was 274 it usually runs around 150-170.  She says she is feeling better from last night but is more thirsty than usual.  Right now her only Diabetes medication is Janumet 50/1000mg  1 two times a day.  She is wondering if she should increase her medication or wait until her follow up visit in April.  Her last A1C was 8.1 in December.    Thanks,   -Sarah Carson

## 2015-12-10 NOTE — Telephone Encounter (Signed)
We can add generic Actos at 15 mg every morning. Her sugars will remain elevated for a couple of weeks from the shot. Continue the Actos even after the shot has cleared her system in a few weeks.

## 2015-12-10 NOTE — Telephone Encounter (Signed)
Pt called wanting to know if she should increase her medications because Dr. Ollen Bowl her neurosurgeon gave her epidural steroid injection yesterday and told her glucose level would go up and it was 274 today already.  Her call back is [phone].  Thanks Barth Kirks

## 2015-12-13 ENCOUNTER — Telehealth: Payer: Self-pay | Admitting: *Deleted

## 2015-12-13 NOTE — Telephone Encounter (Signed)
Error

## 2015-12-13 NOTE — Telephone Encounter (Signed)
Patient called concerning rash. Patient stated that she had an injection 12/09/2015 by Dr. Ollen Bowl at [facility] in [place]. Patient stated that she has broken out in a rash and doesn't know if is from injection or a medication she is taking. Per Elease Hashimoto patient is to call Dr. Ollen Bowl office to let them know she has had a medication reaction.

## 2015-12-13 NOTE — Telephone Encounter (Signed)
Patient called concerning rash. Patient stated that she had an injection 12/09/2015 by Dr. Harkins at [facility] in [place]. Patient stated that she has broken out in a rash and doesn't know if is from injection or a medication she is taking. Per Maloney patient is to call Dr. Harkins office to let them know she has had a medication reaction.  

## 2015-12-16 ENCOUNTER — Telehealth: Payer: Self-pay

## 2015-12-16 DIAGNOSIS — Z1239 Encounter for other screening for malignant neoplasm of breast: Secondary | ICD-10-CM

## 2015-12-16 NOTE — Telephone Encounter (Signed)
Patient advised time for her mammogram-order put in-aa

## 2015-12-25 ENCOUNTER — Ambulatory Visit: Payer: PRIVATE HEALTH INSURANCE | Admitting: Family Medicine

## 2015-12-29 DIAGNOSIS — R200 Anesthesia of skin: Secondary | ICD-10-CM | POA: Insufficient documentation

## 2015-12-29 DIAGNOSIS — M79602 Pain in left arm: Secondary | ICD-10-CM | POA: Insufficient documentation

## 2015-12-31 ENCOUNTER — Other Ambulatory Visit: Payer: Self-pay | Admitting: Neurological Surgery

## 2015-12-31 DIAGNOSIS — M5412 Radiculopathy, cervical region: Secondary | ICD-10-CM

## 2016-01-01 ENCOUNTER — Encounter: Payer: Self-pay | Admitting: Family Medicine

## 2016-01-01 ENCOUNTER — Ambulatory Visit (INDEPENDENT_AMBULATORY_CARE_PROVIDER_SITE_OTHER): Payer: Managed Care, Other (non HMO) | Admitting: Family Medicine

## 2016-01-01 VITALS — BP 128/74 | HR 100 | Temp 98.2°F | Resp 14 | Wt 221.0 lb

## 2016-01-01 DIAGNOSIS — M5412 Radiculopathy, cervical region: Secondary | ICD-10-CM

## 2016-01-01 DIAGNOSIS — E119 Type 2 diabetes mellitus without complications: Secondary | ICD-10-CM

## 2016-01-01 DIAGNOSIS — E785 Hyperlipidemia, unspecified: Secondary | ICD-10-CM

## 2016-01-01 DIAGNOSIS — IMO0002 Reserved for concepts with insufficient information to code with codable children: Secondary | ICD-10-CM

## 2016-01-01 DIAGNOSIS — E668 Other obesity: Secondary | ICD-10-CM | POA: Diagnosis not present

## 2016-01-01 DIAGNOSIS — I10 Essential (primary) hypertension: Secondary | ICD-10-CM

## 2016-01-01 DIAGNOSIS — F330 Major depressive disorder, recurrent, mild: Secondary | ICD-10-CM

## 2016-01-01 DIAGNOSIS — G971 Other reaction to spinal and lumbar puncture: Secondary | ICD-10-CM | POA: Diagnosis not present

## 2016-01-01 LAB — POCT UA - MICROALBUMIN: Microalbumin Ur, POC: 50 mg/L

## 2016-01-01 MED ORDER — OXYCODONE HCL 5 MG PO TABS: 5.0000 mg | ORAL_TABLET | ORAL | Status: DC | PRN

## 2016-01-01 MED ORDER — LOSARTAN POTASSIUM 25 MG PO TABS: 25.0000 mg | ORAL_TABLET | Freq: Every day | ORAL | Status: DC

## 2016-01-01 MED ORDER — PREDNISONE 5 MG PO TABS: 5.0000 mg | ORAL_TABLET | Freq: Every day | ORAL | Status: DC

## 2016-01-01 NOTE — Progress Notes (Signed)
Patient ID: Sarah Carson, female   DOB: 1956-01-09, 60 y.o.   MRN: [id number]    Subjective:  HPI  Patient is here for follow up.  Patient has been seen specialist in [place] for her chronic left shoulder pain. We started patient on Oxycodone 5 mg and the doctor in [place] want for [place] to manage this medication here. On the average she takes 2 tablets daily but most days tries to just take 1 tablet daily. She cant take Norco due to hives with taking in it in the past. Patient have been taking Prednisone for joint pain and hives in 2017 in January and then in April Prednisone 5 mg. When she does not take it joint pain gets worse.  Patient is due for routine labs including A1C, patient states she can take the order and take it to her work and have this done for free.  Depression: Patient feels much better emotionally from December visit. We wanted to switch Lexapro to Cymbalta but patient did not female this switch she improved a few days after that office visit and has felt fine.  Prior to Admission medications   Medication Sig Start Date End Date Taking? Authorizing Provider  escitalopram (LEXAPRO) 20 MG tablet Take 1 tablet (20 mg total) by mouth daily. 06/03/15  Yes Amorette Charrette Hulen Shouts., MD  glucose blood (ACCU-CHEK COMPACT STRIPS) test strip Check sugar once daily-need QS 30 day supply. DX E11.9 04/04/15  Yes Encarnacion Scioneaux Hulen Shouts., MD  glucose blood [facility] VERIO) test strip Use as instructed 04/16/15  Yes Maple Hudson., MD  meclizine (ANTIVERT) 25 MG tablet Take by mouth. 02/09/12  Yes Historical Provider, MD  mometasone (ELOCON) 0.1 % cream Apply 1 application topically daily. 08/27/15  Yes Eshaal Duby Hulen Shouts., MD  omeprazole (PRILOSEC) 40 MG capsule Take 1 capsule (40 mg total) by mouth daily. 06/03/15  Yes Jannetta Massey Hulen Shouts., MD  oxyCODONE (OXY IR/ROXICODONE) 5 MG immediate release tablet Take 1 tablet (5 mg total) by mouth every 4 (four) hours as needed for severe pain.  11/27/15  Yes Anjuli Gemmill Hulen Shouts., MD  PENNSAID 2 % SOLN APPLY 2 PUMPS (2 GRAMS) TO AFFECTED AREA TOPICALLY TWICE DAILY AS DIRECTED 08/18/15  Yes Historical Provider, MD  predniSONE (STERAPRED UNI-PAK 21 TAB) 10 MG (21) TBPK tablet Take 1 tablet (10 mg total) by mouth daily. Take 6 pills on day one then decrease by 1 pill each day 10/13/15  Yes Faythe Ghee, PA-C  sitaGLIPtin-metformin (JANUMET) 50-1000 MG per tablet Take 1 tablet by mouth 2 (two) times daily with a meal. 06/03/15  Yes Maple Hudson., MD  Verapamil HCl CR 300 MG CP24 Take 1 capsule (300 mg total) by mouth daily. 06/03/15  Yes Eugune Sine Hulen Shouts., MD    Patient Active Problem List   Diagnosis Date Noted  . Dermatitis 08/27/2015  . 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 Cardiac dysrhythmia 04/23/2015  . Arthritis 04/23/2015  . Airway hyperreactivity 04/23/2015  . Carpal tunnel syndrome 04/23/2015  . Cellulitis 04/23/2015  . Bronchitis, chronic (HCC) 04/23/2015  . Diabetes mellitus, type 2 (HCC) 04/23/2015  . Blood pressure elevated 04/23/2015  . Essential (primary) hypertension 04/23/2015  . Fibrositis 04/23/2015  . Anxiety, generalized 04/23/2015  . Gastro-esophageal reflux disease without esophagitis 04/23/2015  . Herniation of nucleus pulposus 04/23/2015  . HLD (hyperlipidemia) 04/23/2015  . Hypersomnia with sleep apnea 04/23/2015  . 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 Adult BMI  30+ 04/23/2015  . 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 Knee torn cartilage 04/23/2015    No past medical history on file.  Social History   Social History  . Marital Status: Married    Spouse Name: [name]  . Number of Children: 2  . Years of Education: college   Occupational History  . office manager    Social History Main Topics  . Smoking status: Never Smoker   . Smokeless tobacco: Never Used  . Alcohol Use: No  . Drug Use: No  . Sexual Activity: Not Currently   Other Topics  Concern  . Not on file   Social History Narrative    Allergies  Allergen Reactions  . Levalbuterol   . Liraglutide   . Propoxyphene   . Rofecoxib Other (See Comments)  . Toradol [Ketorolac Tromethamine] Other (See Comments)    Vomiting, severe lower back pain/cramping and abdomen area.  . Hydrocodone-Acetaminophen Itching and Rash    Per patient    Review of Systems  Constitutional: Positive for malaise/fatigue.  Respiratory: Negative.   Cardiovascular: Negative.   Musculoskeletal: Positive for back pain, joint pain (shoulder and arm -left side) and neck pain.  Psychiatric/Behavioral: Negative for depression. The patient is not nervous/anxious and does not have insomnia.     Immunization History  Administered Date(s) Administered  . Tdap 03/21/2007  . Zoster 07/22/2011   Objective:  BP 128/74 mmHg  Pulse 100  Temp(Src) 98.2 F (36.8 C)  Resp 14  Wt 221 lb (100.245 kg)  Physical Exam  Constitutional: She is oriented to person, place, and time and well-developed, well-nourished, and in no distress.  HENT:  Head: Normocephalic and atraumatic.  Right Ear: External ear normal.  Left Ear: External ear normal.  Nose: Nose normal.  Eyes: Conjunctivae are normal.  Neck: Normal range of motion. Neck supple.  Cardiovascular: Normal rate, regular rhythm, normal heart sounds and intact distal pulses.   No murmur heard. Pulmonary/Chest: Effort normal and breath sounds normal. No respiratory distress. She has no wheezes.  Abdominal: Soft. There is no tenderness.  Neurological: She is alert and oriented to person, place, and time. She has intact cranial nerves. Gait normal. GCS score is 15.  Upper extremity Right is 5+, Left triceps and biceps are 4+. And grip is 4-.  Skin: Skin is warm and dry.  Psychiatric: Mood, memory, affect and judgment normal.    Lab Results  Component Value Date   WBC 8.7 09/21/2013   HGB 11.2* 09/21/2013   HCT 34* 09/21/2013   PLT 237  09/21/2013   CHOL 164 09/29/2012   TRIG 171* 09/29/2012   HDL 41 09/29/2012   LDLCALC 89 09/29/2012   TSH 2.91 09/29/2012   HGBA1C 8.2 08/27/2015    CMP     Component Value Date/Time   NA 137 09/29/2012   K 4.3 09/29/2012   BUN 9 09/29/2012   CREATININE 0.5 09/29/2012   AST 32 09/29/2012   ALT 26 09/29/2012   ALKPHOS 77 09/29/2012    Assessment and Plan :  1. Cervical radiculopathy Chronic, has been following neurosurgeon in [place]. Advised patient to ask them to send [place] notes. I will follow Oxycodone RX. Discussed with patient long term use of Prednisone. Patient is having CT myelogram next week April 26th, will give RX for Prednisone 5 mg for 10 days.  2. Type 2 diabetes mellitus without complication, without long-term current use of insulin (HCC) Will check labs, pending results. Patient will get  this done through work and patient will get them to fax the results to [place]. If sugars level is worse will add Actos, patient did not take it when we started it in march due to reading side effects, discussed this with patient today. UA microalbumin is 50 today, add Losartan 25 mg daily. - CBC with Differential/Platelet - HgB A1c  3. Essential (primary) hypertension Stable.  4. Depression, major, recurrent, mild (HCC) Patient is doing much better today. Follow. No changes today. - CBC with Differential/Platelet - TSH  5. HLD (hyperlipidemia) - Comprehensive metabolic panel - Lipid Panel With LDL/HDL Ratio  6. Adult BMI 30+ - TSH I have done the exam and reviewed the above chart and it is accurate to the best of my knowledge.  Patient was seen and examined by Dr. Bosie Clos and note was scribed by Samara Deist, RMA.  Julieanne Manson MD [facility] 01/01/2016 2:48 PM

## 2016-01-07 ENCOUNTER — Ambulatory Visit
Admission: RE | Admit: 2016-01-07 | Discharge: 2016-01-07 | Disposition: A | Discharge status: Home / Self Care | Payer: Managed Care, Other (non HMO) | Source: Ambulatory Visit | Attending: Neurological Surgery | Admitting: Neurological Surgery

## 2016-01-07 ENCOUNTER — Other Ambulatory Visit: Payer: Self-pay | Admitting: Physician Assistant

## 2016-01-07 DIAGNOSIS — M5412 Radiculopathy, cervical region: Secondary | ICD-10-CM

## 2016-01-07 DIAGNOSIS — Z0000 Encounter for general adult medical examination without abnormal findings: Secondary | ICD-10-CM

## 2016-01-07 LAB — LIPID PANEL
Cholesterol: 175 mg/dL (ref 0–200)
HDL: 42 mg/dL (ref 35–70)
LDL CALC: 85 mg/dL
LDL/HDL RATIO: 4.2

## 2016-01-07 LAB — TSH: TSH: 2.97 u[IU]/mL (ref 0.41–5.90)

## 2016-01-07 LAB — BASIC METABOLIC PANEL
CREATININE: 0.6 mg/dL (ref <=1.1)
GLUCOSE: 266 mg/dL

## 2016-01-07 LAB — HEPATIC FUNCTION PANEL: Bilirubin, Total: 0.2 mg/dL

## 2016-01-07 LAB — CBC AND DIFFERENTIAL
HEMATOCRIT: 37 % (ref 36–46)
HEMOGLOBIN: 11.7 g/dL — AB (ref 12.0–16.0)
PLATELETS: 289 10*3/uL (ref 150–399)

## 2016-01-07 MED ORDER — MEPERIDINE HCL 100 MG/ML IJ SOLN
100.0000 mg | Freq: Once | INTRAMUSCULAR | Status: AC
  Administered 2016-01-07: 100 mg via INTRAMUSCULAR

## 2016-01-07 MED ORDER — ONDANSETRON HCL 4 MG/2ML IJ SOLN
4.0000 mg | Freq: Once | INTRAMUSCULAR | Status: AC
  Administered 2016-01-07: 4 mg via INTRAMUSCULAR

## 2016-01-07 MED ORDER — DIAZEPAM 5 MG PO TABS
10.0000 mg | ORAL_TABLET | Freq: Once | ORAL | Status: AC
  Administered 2016-01-07: 10 mg via ORAL

## 2016-01-07 MED ORDER — IOPAMIDOL (ISOVUE-M 300) INJECTION 61%
15.0000 mL | Freq: Once | INTRAMUSCULAR | Status: AC | PRN
  Administered 2016-01-07: 15 mL via INTRATHECAL

## 2016-01-07 NOTE — Progress Notes (Signed)
Patient came in to have blood drawn per Dr. Elisabeth Cara orders.  Blood was drawn from the right arm without any incident.

## 2016-01-07 NOTE — Progress Notes (Signed)
Pt states she has been off Lexapro for the past 2 days.

## 2016-01-07 NOTE — Discharge Instructions (Signed)
Myelogram Discharge Instructions  1. Go home and rest quietly for the next 24 hours.  It is important to lie flat for the next 24 hours.  Get up only to go to the restroom.  You may lie in the bed or on a couch on your back, your stomach, your left side or your right side.  You may have one pillow under your head.  You may have pillows between your knees while you are on your side or under your knees while you are on your back.  2. DO NOT drive today.  Recline the seat as far back as it will go, while still wearing your seat belt, on the way home.  3. You may get up to go to the bathroom as needed.  You may sit up for 10 minutes to eat.  You may resume your normal diet and medications unless otherwise indicated.  Drink lots of extra fluids today and tomorrow.  4. The incidence of headache, nausea, or vomiting is about 5% (one in 20 patients).  If you develop a headache, lie flat and drink plenty of fluids until the headache goes away.  Caffeinated beverages may be helpful.  If you develop severe nausea and vomiting or a headache that does not go away with flat bed rest, call [phone].  5. You may resume normal activities after your 24 hours of bed rest is over; however, do not exert yourself strongly or do any heavy lifting tomorrow. If when you get up you have a headache when standing, go back to bed and force fluids for another 24 hours.  6. Call your physician for a follow-up appointment.  The results of your myelogram will be sent directly to your physician by the following day.  7. If you have any questions or if complications develop after you arrive home, please call [phone].  Discharge instructions have been explained to the patient.  The patient, or the person responsible for the patient, fully understands these instructions.       May resume Lexapro on January 08, 2016, after 11:00 am.

## 2016-01-08 LAB — CMP12+LP+TP+TSH+6AC+CBC/D/PLT
ALK PHOS: 80 IU/L (ref 39–117)
ALT: 26 IU/L (ref 0–32)
AST: 20 IU/L (ref 0–40)
Albumin/Globulin Ratio: 2 (ref 1.2–2.2)
Albumin: 4.5 g/dL (ref 3.6–4.8)
BASOS ABS: 0.1 10*3/uL (ref 0.0–0.2)
BILIRUBIN TOTAL: 0.2 mg/dL (ref 0.0–1.2)
BUN / CREAT RATIO: 17 (ref 12–28)
BUN: 11 mg/dL (ref 8–27)
Basos: 1 %
CALCIUM: 9.4 mg/dL (ref 8.7–10.3)
CHLORIDE: 99 mmol/L (ref 96–106)
Chol/HDL Ratio: 4.2 ratio units (ref 0.0–4.4)
Cholesterol, Total: 175 mg/dL (ref 100–199)
Creatinine, Ser: 0.65 mg/dL (ref 0.57–1.00)
EOS (ABSOLUTE): 0.3 10*3/uL (ref 0.0–0.4)
EOS: 4 %
Estimated CHD Risk: 0.9 times avg. (ref 0.0–1.0)
FREE THYROXINE INDEX: 2.2 (ref 1.2–4.9)
GFR calc non Af Amer: 96 mL/min/{1.73_m2} (ref >59)
GFR, EST AFRICAN AMERICAN: 111 mL/min/{1.73_m2} (ref >59)
GGT: 31 IU/L (ref 0–60)
GLUCOSE: 266 mg/dL — AB (ref 65–99)
Globulin, Total: 2.3 g/dL (ref 1.5–4.5)
HDL: 42 mg/dL (ref >39)
HEMOGLOBIN: 11.7 g/dL (ref 11.1–15.9)
Hematocrit: 37 % (ref 34.0–46.6)
IMMATURE GRANULOCYTES: 0 %
Immature Grans (Abs): 0 10*3/uL (ref 0.0–0.1)
Iron: 63 ug/dL (ref 27–139)
LDH: 156 IU/L (ref 119–226)
LDL CALC: 85 mg/dL (ref 0–99)
LYMPHS ABS: 2.9 10*3/uL (ref 0.7–3.1)
Lymphs: 32 %
MCH: 27 pg (ref 26.6–33.0)
MCHC: 31.6 g/dL (ref 31.5–35.7)
MCV: 85 fL (ref 79–97)
MONOCYTES: 9 %
Monocytes Absolute: 0.8 10*3/uL (ref 0.1–0.9)
NEUTROS PCT: 54 %
Neutrophils Absolute: 4.8 10*3/uL (ref 1.4–7.0)
PLATELETS: 289 10*3/uL (ref 150–379)
Phosphorus: 3.4 mg/dL (ref 2.5–4.5)
Potassium: 4.4 mmol/L (ref 3.5–5.2)
RBC: 4.34 x10E6/uL (ref 3.77–5.28)
RDW: 15.8 % — AB (ref 12.3–15.4)
Sodium: 141 mmol/L (ref 134–144)
T3 UPTAKE RATIO: 23 % — AB (ref 24–39)
T4, Total: 9.6 ug/dL (ref 4.5–12.0)
TOTAL PROTEIN: 6.8 g/dL (ref 6.0–8.5)
TSH: 2.97 u[IU]/mL (ref 0.450–4.500)
Triglycerides: 240 mg/dL — ABNORMAL HIGH (ref 0–149)
URIC ACID: 4 mg/dL (ref 2.5–7.1)
VLDL CHOLESTEROL CAL: 48 mg/dL — AB (ref 5–40)
WBC: 8.9 10*3/uL (ref 3.4–10.8)

## 2016-01-08 LAB — HGB A1C W/O EAG: Hgb A1c MFr Bld: 8.9 % — ABNORMAL HIGH (ref 4.8–5.6)

## 2016-01-26 ENCOUNTER — Encounter: Payer: Self-pay | Admitting: Family Medicine

## 2016-02-11 ENCOUNTER — Other Ambulatory Visit: Payer: Self-pay

## 2016-02-11 MED ORDER — TRAZODONE HCL 100 MG PO TABS: 100.0000 mg | ORAL_TABLET | Freq: Every day | ORAL | Status: DC

## 2016-04-01 ENCOUNTER — Ambulatory Visit (INDEPENDENT_AMBULATORY_CARE_PROVIDER_SITE_OTHER): Payer: Managed Care, Other (non HMO) | Admitting: Family Medicine

## 2016-04-01 VITALS — BP 134/62 | HR 72 | Temp 98.0°F | Resp 16 | Wt 215.0 lb

## 2016-04-01 DIAGNOSIS — F330 Major depressive disorder, recurrent, mild: Secondary | ICD-10-CM

## 2016-04-01 DIAGNOSIS — E119 Type 2 diabetes mellitus without complications: Secondary | ICD-10-CM | POA: Diagnosis not present

## 2016-04-01 DIAGNOSIS — I10 Essential (primary) hypertension: Secondary | ICD-10-CM

## 2016-04-01 DIAGNOSIS — E785 Hyperlipidemia, unspecified: Secondary | ICD-10-CM | POA: Diagnosis not present

## 2016-04-01 NOTE — Progress Notes (Signed)
Subjective:  HPI  Patient is here for 3 months follow up.  Diabetes: Too early to re check A1C today but patient would like to take lab slip and get this done later this month at work. Lab Results  Component Value Date   HGBA1C 8.9* 01/07/2016   Depression: Husband passed away recently, emotionally trying to adjust to this. Depression screen PHQ 2/9 04/01/2016  Decreased Interest 0  Down, Depressed, Hopeless 1  PHQ - 2 Score 1  Altered sleeping 1  Tired, decreased energy 0  Change in appetite 0  Feeling bad or failure about yourself  0  Trouble concentrating 0  Moving slowly or fidgety/restless 0  Suicidal thoughts 0  PHQ-9 Score 2  Difficult doing work/chores Not difficult at all     Prior to Admission medications   Medication Sig Start Date End Date Taking? Authorizing Provider  escitalopram (LEXAPRO) 20 MG tablet Take 1 tablet (20 mg total) by mouth daily. 06/03/15  Yes Richard Hulen Shouts., MD  glucose blood (ACCU-CHEK COMPACT STRIPS) test strip Check sugar once daily-need QS 30 day supply. DX E11.9 04/04/15  Yes Richard Hulen Shouts., MD  glucose blood [facility] VERIO) test strip Use as instructed 04/16/15  Yes Maple Hudson., MD  losartan (COZAAR) 25 MG tablet Take 1 tablet (25 mg total) by mouth daily. 01/01/16  Yes Richard Hulen Shouts., MD  omeprazole (PRILOSEC) 40 MG capsule Take 1 capsule (40 mg total) by mouth daily. 06/03/15  Yes Richard Hulen Shouts., MD  PENNSAID 2 % SOLN APPLY 2 PUMPS (2 GRAMS) TO AFFECTED AREA TOPICALLY TWICE DAILY AS DIRECTED 08/18/15  Yes Historical Provider, MD  sitaGLIPtin-metformin (JANUMET) 50-1000 MG per tablet Take 1 tablet by mouth 2 (two) times daily with a meal. 06/03/15  Yes Maple Hudson., MD  Verapamil HCl CR 300 MG CP24 Take 1 capsule (300 mg total) by mouth daily. 06/03/15  Yes Richard Hulen Shouts., MD  meclizine (ANTIVERT) 25 MG tablet Take by mouth. Reported on 04/01/2016 02/09/12   Historical Provider, MD  oxyCODONE  (OXY IR/ROXICODONE) 5 MG immediate release tablet Take 1 tablet (5 mg total) by mouth every 4 (four) hours as needed for severe pain. Patient not taking: Reported on 04/01/2016 01/01/16   Maple Hudson., MD  oxyCODONE (OXY IR/ROXICODONE) 5 MG immediate release tablet Take 1 tablet (5 mg total) by mouth every 4 (four) hours as needed for severe pain. Patient not taking: Reported on 04/01/2016 01/01/16   Maple Hudson., MD  traZODone (DESYREL) 100 MG tablet Take 1 tablet (100 mg total) by mouth at bedtime. Patient not taking: Reported on 04/01/2016 02/11/16   Maple Hudson., MD    Patient Active Problem List   Diagnosis Date Noted  . Dermatitis 08/27/2015  . 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 Cardiac dysrhythmia 04/23/2015  . Arthritis 04/23/2015  . Airway hyperreactivity 04/23/2015  . Carpal tunnel syndrome 04/23/2015  . Cellulitis 04/23/2015  . Bronchitis, chronic (HCC) 04/23/2015  . Diabetes mellitus, type 2 (HCC) 04/23/2015  . Blood pressure elevated 04/23/2015  . Essential (primary) hypertension 04/23/2015  . Fibrositis 04/23/2015  . Anxiety, generalized 04/23/2015  . Gastro-esophageal reflux disease without esophagitis 04/23/2015  . Herniation of nucleus pulposus 04/23/2015  . HLD (hyperlipidemia) 04/23/2015  . Hypersomnia with sleep apnea 04/23/2015  . 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 Adult BMI 30+ 04/23/2015  . 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  Knee torn cartilage 04/23/2015    No past medical history on file.  Social History   Social History  . Marital Status: Married    Spouse Name: [name]  . Number of Children: 2  . Years of Education: college   Occupational History  . office manager    Social History Main Topics  . Smoking status: Never Smoker   . Smokeless tobacco: Never Used  . Alcohol Use: No  . Drug Use: No  . Sexual Activity: Not Currently   Other Topics Concern  . Not on file   Social  History Narrative    Allergies  Allergen Reactions  . Darvon [Propoxyphene]   . Liraglutide   . Toradol [Ketorolac Tromethamine] Other (See Comments)    Vomiting, severe lower back pain/cramping and abdomen area.  . Vioxx [Rofecoxib] Other (See Comments)  . Xopenex [Levalbuterol]   . Hydrocodone-Acetaminophen Itching and Rash    Per patient    Review of Systems  Constitutional: Negative.   HENT: Negative.   Eyes: Negative.   Respiratory: Negative.   Cardiovascular: Negative.   Gastrointestinal: Negative.   Musculoskeletal: Positive for myalgias and joint pain.  Skin: Negative.   Neurological: Negative.   Endo/Heme/Allergies: Negative.   Psychiatric/Behavioral: Positive for depression. The patient has insomnia.     Immunization History  Administered Date(s) Administered  . Tdap 03/21/2007  . Zoster 07/22/2011   Objective:  BP 134/62 mmHg  Pulse 72  Temp(Src) 98 F (36.7 C)  Resp 16  Wt 215 lb (97.523 kg)  Physical Exam  Constitutional: She is oriented to person, place, and time and well-developed, well-nourished, and in no distress.  HENT:  Head: Normocephalic and atraumatic.  Right Ear: External ear normal.  Left Ear: External ear normal.  Nose: Nose normal.  Eyes: Conjunctivae are normal.  Neck: Neck supple.  Cardiovascular: Normal rate, regular rhythm, normal heart sounds and intact distal pulses.   Abdominal: Soft.  Neurological: She is alert and oriented to person, place, and time.  Skin: Skin is warm and dry.  Psychiatric: Memory, affect and judgment normal.    Lab Results  Component Value Date   WBC 8.9 01/07/2016   HGB 11.7* 01/07/2016   HCT 37.0 01/07/2016   PLT 289 01/07/2016   GLUCOSE 266* 01/07/2016   CHOL 175 01/07/2016   TRIG 240* 01/07/2016   HDL 42 01/07/2016   LDLCALC 85 01/07/2016   TSH 2.970 01/07/2016   HGBA1C 8.9* 01/07/2016    CMP     Component Value Date/Time   NA 141 01/07/2016 1030   K 4.4 01/07/2016 1030   CL 99  01/07/2016 1030   GLUCOSE 266* 01/07/2016 1030   BUN 11 01/07/2016 1030   CREATININE 0.65 01/07/2016 1030   CREATININE 0.6 01/07/2016   CALCIUM 9.4 01/07/2016 1030   PROT 6.8 01/07/2016 1030   ALBUMIN 4.5 01/07/2016 1030   AST 20 01/07/2016 1030   ALT 26 01/07/2016 1030   ALKPHOS 80 01/07/2016 1030   BILITOT 0.2 01/07/2016 1030   GFRNONAA 96 01/07/2016 1030   GFRAA 111 01/07/2016 1030    Assessment and Plan :  1. Type 2 diabetes mellitus without complication, without long-term current use of insulin (HCC) Ordered to get done through work - HgB A1c Exercise discussed at length. 2. Depression, major, recurrent, mild (HCC) Morning appropriately the sudden death of her husband last month. PHQ- 9 score today is 2. 3. HLD (hyperlipidemia)  4. Essential (primary) hypertension 5. Obesity Necessary dietary changes are  discussed at length. More than 50% of this visit is spent in counseling regarding these issues. Patient was seen and examined by Dr. Bosie Clos and note was scribed by Samara Deist, RMA.   Julieanne Manson MD [facility] 04/01/2016 2:47 PM

## 2016-04-01 NOTE — Patient Instructions (Addendum)
Try mediterranean diet   Why follow it? Research shows. . Those who follow the Mediterranean diet have a reduced risk of heart disease  . The diet is associated with a reduced incidence of Parkinson's and Alzheimer's diseases . People following the diet may have longer life expectancies and lower rates of chronic diseases  . The Dietary Guidelines for Americans recommends the Mediterranean diet as an eating plan to promote health and prevent disease  What Is the Mediterranean Diet?  . Healthy eating plan based on typical foods and recipes of Mediterranean-style cooking . The diet is primarily a plant based diet; these foods should make up a majority of meals   Starches - Plant based foods should make up a majority of meals - They are an important sources of vitamins, minerals, energy, antioxidants, and fiber - Choose whole grains, foods high in fiber and minimally processed items  - Typical grain sources include wheat, oats, barley, corn, brown rice, bulgar, farro, millet, polenta, couscous  - Various types of beans include chickpeas, lentils, fava beans, black beans, white beans   Fruits  Veggies - Large quantities of antioxidant rich fruits & veggies; 6 or more servings  - Vegetables can be eaten raw or lightly drizzled with oil and cooked  - Vegetables common to the traditional Mediterranean Diet include: artichokes, arugula, beets, broccoli, brussel sprouts, cabbage, carrots, celery, collard greens, cucumbers, eggplant, kale, leeks, lemons, lettuce, mushrooms, okra, onions, peas, peppers, potatoes, pumpkin, radishes, rutabaga, shallots, spinach, sweet potatoes, turnips, zucchini - Fruits common to the Mediterranean Diet include: apples, apricots, avocados, cherries, clementines, dates, figs, grapefruits, grapes, melons, nectarines, oranges, peaches, pears, pomegranates, strawberries, tangerines  Fats - Replace butter and margarine with healthy oils, such as olive oil, canola oil, and tahini   - Limit nuts to no more than a handful a day  - Nuts include walnuts, almonds, pecans, pistachios, pine nuts  - Limit or avoid candied, honey roasted or heavily salted nuts - Olives are central to the [employer] - can be eaten whole or used in a variety of dishes   Meats Protein - Limiting red meat: no more than a few times a month - When eating red meat: choose lean cuts and keep the portion to the size of deck of cards - Eggs: approx. 0 to 4 times a week  - Fish and lean poultry: at least 2 a week  - Healthy protein sources include, chicken, [place], lean beef, lamb - Increase intake of seafood such as tuna, salmon, trout, mackerel, shrimp, scallops - Avoid or limit high fat processed meats such as sausage and bacon  Dairy - Include moderate amounts of low fat dairy products  - Focus on healthy dairy such as fat free yogurt, skim milk, low or reduced fat cheese - Limit dairy products higher in fat such as whole or 2% milk, cheese, ice cream  Alcohol - Moderate amounts of red wine is ok  - No more than 5 oz daily for women (all ages) and men older than age 29  - No more than 10 oz of wine daily for men younger than 33  Other - Limit sweets and other desserts  - Use herbs and spices instead of salt to flavor foods  - Herbs and spices common to the traditional Mediterranean Diet include: basil, bay leaves, chives, cloves, cumin, fennel, garlic, lavender, marjoram, mint, oregano, parsley, pepper, rosemary, sage, savory, sumac, tarragon, thyme   It's not just a diet, it's a lifestyle:  .  The Mediterranean diet includes lifestyle factors typical of those in the region  . Foods, drinks and meals are best eaten with others and savored . Daily physical activity is important for overall good health . This could be strenuous exercise like running and aerobics . This could also be more leisurely activities such as walking, housework, yard-work, or taking the stairs . Moderation is the key;  a balanced and healthy diet accommodates most foods and drinks . Consider portion sizes and frequency of consumption of certain foods   Meal Ideas & Options:  . Breakfast:  o Whole wheat toast or whole wheat English muffins with peanut butter & hard boiled egg o Steel cut oats topped with apples & cinnamon and skim milk  o Fresh fruit: banana, strawberries, melon, berries, peaches  o Smoothies: strawberries, bananas, greek yogurt, peanut butter o Low fat greek yogurt with blueberries and granola  o Egg white omelet with spinach and mushrooms o Breakfast couscous: whole wheat couscous, apricots, skim milk, cranberries  . Sandwiches:  o Hummus and grilled vegetables (peppers, zucchini, squash) on whole wheat bread   o Grilled chicken on whole wheat pita with lettuce, tomatoes, cucumbers or tzatziki  o Tuna salad on whole wheat bread: tuna salad made with greek yogurt, olives, red peppers, capers, green onions o Garlic rosemary lamb pita: lamb sauted with garlic, rosemary, salt & pepper; add lettuce, cucumber, greek yogurt to pita - flavor with lemon juice and black pepper  . Seafood:  o Mediterranean grilled salmon, seasoned with garlic, basil, parsley, lemon juice and black pepper o Shrimp, lemon, and spinach whole-grain pasta salad made with low fat greek yogurt  o Seared scallops with lemon orzo  o Seared tuna steaks seasoned salt, pepper, coriander topped with tomato mixture of olives, tomatoes, olive oil, minced garlic, parsley, green onions and cappers  . Meats:  o Herbed greek chicken salad with kalamata olives, cucumber, feta  o Red bell peppers stuffed with spinach, bulgur, lean ground beef (or lentils) & topped with feta   o Kebabs: skewers of chicken, tomatoes, onions, zucchini, squash  o [place] burgers: made with red onions, mint, dill, lemon juice, feta cheese topped with roasted red peppers . Vegetarian o Cucumber salad: cucumbers, artichoke hearts, celery, red onion, feta  cheese, tossed in olive oil & lemon juice  o Hummus and whole grain pita points with a greek salad (lettuce, tomato, feta, olives, cucumbers, red onion) o Lentil soup with celery, carrots made with vegetable broth, garlic, salt and pepper  o Tabouli salad: parsley, bulgur, mint, scallions, cucumbers, tomato, radishes, lemon juice, olive oil, salt and pepper.

## 2016-04-07 ENCOUNTER — Other Ambulatory Visit: Payer: Self-pay

## 2016-04-07 DIAGNOSIS — Z299 Encounter for prophylactic measures, unspecified: Secondary | ICD-10-CM

## 2016-04-07 NOTE — Progress Notes (Signed)
Patient came in to have blood drawn per Dr. Elisabeth Cara authorization.

## 2016-04-08 LAB — HGB A1C W/O EAG: Hgb A1c MFr Bld: 8.9 % — ABNORMAL HIGH (ref 4.8–5.6)

## 2016-04-12 ENCOUNTER — Encounter: Payer: Self-pay | Admitting: Family Medicine

## 2016-04-28 ENCOUNTER — Other Ambulatory Visit: Payer: Self-pay

## 2016-04-28 MED ORDER — SITAGLIPTIN PHOS-METFORMIN HCL 50-1000 MG PO TABS: 1.0000 | ORAL_TABLET | Freq: Two times a day (BID) | ORAL | 3 refills | Status: DC

## 2016-05-03 ENCOUNTER — Other Ambulatory Visit: Payer: Self-pay

## 2016-05-03 MED ORDER — ESCITALOPRAM OXALATE 20 MG PO TABS: 20.0000 mg | ORAL_TABLET | Freq: Every day | ORAL | 3 refills | Status: DC

## 2016-05-03 MED ORDER — VERAPAMIL HCL ER 300 MG PO CP24: 300.0000 mg | ORAL_CAPSULE | Freq: Every day | ORAL | 3 refills | Status: DC

## 2016-05-03 MED ORDER — OMEPRAZOLE 40 MG PO CPDR: 40.0000 mg | DELAYED_RELEASE_CAPSULE | Freq: Every day | ORAL | 3 refills | Status: DC

## 2016-07-12 ENCOUNTER — Ambulatory Visit (INDEPENDENT_AMBULATORY_CARE_PROVIDER_SITE_OTHER): Payer: Managed Care, Other (non HMO) | Admitting: Family Medicine

## 2016-07-12 VITALS — BP 144/68 | HR 100 | Temp 98.2°F | Resp 14 | Wt 219.0 lb

## 2016-07-12 DIAGNOSIS — E119 Type 2 diabetes mellitus without complications: Secondary | ICD-10-CM

## 2016-07-12 DIAGNOSIS — F330 Major depressive disorder, recurrent, mild: Secondary | ICD-10-CM | POA: Diagnosis not present

## 2016-07-12 DIAGNOSIS — Z2821 Immunization not carried out because of patient refusal: Secondary | ICD-10-CM

## 2016-07-12 DIAGNOSIS — M1990 Unspecified osteoarthritis, unspecified site: Secondary | ICD-10-CM

## 2016-07-12 DIAGNOSIS — I10 Essential (primary) hypertension: Secondary | ICD-10-CM

## 2016-07-12 MED ORDER — PENNSAID 2 % TD SOLN: TRANSDERMAL | 5 refills | Status: DC

## 2016-07-12 NOTE — Progress Notes (Signed)
Sarah Carson  MRN: [id number] DOB: Apr 13, 1956  Subjective:  HPI  Patient is here for follow up. Last visit was in July.  Depression: has good and bad days. Today is not a good day. She is taking Lexapro and Verapamil. Takes Trazodone sometimes because the next day she feels out of it. IN July PHQ9 score was 2. Jont pain: takes Oxycodone sometimes, uses Pennsaid gel. Diabetes: she does not check her sugar at home. Lab Results  Component Value Date   HGBA1C 8.9 (H) 04/07/2016    Patient Active Problem List   Diagnosis Date Noted  . Dermatitis 08/27/2015  . 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 Cardiac dysrhythmia 04/23/2015  . Arthritis 04/23/2015  . Airway hyperreactivity 04/23/2015  . Carpal tunnel syndrome 04/23/2015  . Cellulitis 04/23/2015  . Bronchitis, chronic (HCC) 04/23/2015  . Diabetes mellitus, type 2 (HCC) 04/23/2015  . Blood pressure elevated 04/23/2015  . Essential (primary) hypertension 04/23/2015  . Fibrositis 04/23/2015  . Anxiety, generalized 04/23/2015  . Gastro-esophageal reflux disease without esophagitis 04/23/2015  . Herniation of nucleus pulposus 04/23/2015  . HLD (hyperlipidemia) 04/23/2015  . Hypersomnia with sleep apnea 04/23/2015  . 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 Adult BMI 30+ 04/23/2015  . 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 Knee torn cartilage 04/23/2015    No past medical history on file.  Social History   Social History  . Marital status: Married    Spouse name: [name]  . Number of children: 2  . Years of education: college   Occupational History  . office manager    Social History Main Topics  . Smoking status: Never Smoker  . Smokeless tobacco: Never Used  . Alcohol use No  . Drug use: No  . Sexual activity: Not Currently   Other Topics Concern  . Not on file   Social History Narrative  . No narrative on file    Outpatient Encounter Prescriptions as of 07/12/2016    Medication Sig Note  . escitalopram (LEXAPRO) 20 MG tablet Take 1 tablet (20 mg total) by mouth daily.   Marland Kitchen glucose blood (ACCU-CHEK COMPACT STRIPS) test strip Check sugar once daily-need QS 30 day supply. DX E11.9   . glucose blood (ONETOUCH VERIO) test strip Use as instructed   . losartan (COZAAR) 25 MG tablet Take 1 tablet (25 mg total) by mouth daily.   Marland Kitchen omeprazole (PRILOSEC) 40 MG capsule Take 1 capsule (40 mg total) by mouth daily.   Marland Kitchen oxyCODONE (OXY IR/ROXICODONE) 5 MG immediate release tablet Take 1 tablet (5 mg total) by mouth every 4 (four) hours as needed for severe pain. 04/01/2016: As needed  . PENNSAID 2 % SOLN APPLY 2 PUMPS (2 GRAMS) TO AFFECTED AREA TOPICALLY TWICE DAILY AS DIRECTED   . sitaGLIPtin-metformin (JANUMET) 50-1000 MG tablet Take 1 tablet by mouth 2 (two) times daily with a meal.   . traZODone (DESYREL) 100 MG tablet Take 1 tablet (100 mg total) by mouth at bedtime. 04/01/2016: 1/2 tablet as needed  . Verapamil HCl CR 300 MG CP24 Take 1 capsule (300 mg total) by mouth daily.   . meclizine (ANTIVERT) 25 MG tablet Take by mouth. Reported on 04/01/2016 04/23/2015: Medication taken as needed.  Received from: [facility]  . [DISCONTINUED] oxyCODONE (OXY IR/ROXICODONE) 5 MG immediate release tablet Take 1 tablet (5 mg total) by mouth every 4 (four) hours as needed for severe pain. (Patient not taking: Reported on 04/01/2016) 04/01/2016: As needed  . [  DISCONTINUED] PENNSAID 2 % SOLN APPLY 2 PUMPS (2 GRAMS) TO AFFECTED AREA TOPICALLY TWICE DAILY AS DIRECTED 10/13/2015: Received from: External Pharmacy   No facility-administered encounter medications on file as of 07/12/2016.     Allergies  Allergen Reactions  . Darvon [Propoxyphene]   . Liraglutide   . Toradol [Ketorolac Tromethamine] Other (See Comments)    Vomiting, severe lower back pain/cramping and abdomen area.  . Vioxx [Rofecoxib] Other (See Comments)  . Xopenex [Levalbuterol]   .  Hydrocodone-Acetaminophen Itching and Rash    Per patient    Review of Systems  Constitutional: Negative.   Eyes: Negative.   Respiratory: Negative.   Cardiovascular: Negative.   Gastrointestinal: Negative.   Musculoskeletal: Positive for back pain and joint pain.  Endo/Heme/Allergies: Negative.   Psychiatric/Behavioral: Positive for depression. The patient has insomnia.    Objective:  BP (!) 144/68   Pulse 100   Temp 98.2 F (36.8 C)   Resp 14   Wt 219 lb (99.3 kg)   BMI 36.44 kg/m   Physical Exam  Constitutional: She is well-developed, well-nourished, and in no distress.  HENT:  Head: Normocephalic and atraumatic.  Right Ear: External ear normal.  Left Ear: External ear normal.  Nose: Nose normal.  Eyes: Conjunctivae are normal. Pupils are equal, round, and reactive to light.  Neck: Normal range of motion. Neck supple.  Cardiovascular: Normal rate, regular rhythm, normal heart sounds and intact distal pulses.   No murmur heard. Pulmonary/Chest: Effort normal and breath sounds normal. No respiratory distress. She has no wheezes.  Abdominal: Soft.  Neurological: She is alert.  Skin: Skin is warm and dry.  Psychiatric: Mood, memory, affect and judgment normal.    Assessment and Plan :  1. Type 2 diabetes mellitus without complication, without long-term current use of insulin (HCC) Will provide lab slip to check A1C at work. Advised patient she needs to start checking sugar fasting-stressed the importance of this.Patient is not checking sugars at all at this time.  2. Depression, major, recurrent, mild (HCC) Patient feels better compared to her last visit in July. Today is not a good day but overall better.  3. Essential (primary) hypertension Stable.  4. Influenza vaccination declined by patient 5. Arthritis Pennsaid refill given. Symptoms stable. HPI, Exam and A&P transcribed under direction and in the presence of Julieanne Manson, MD. I have done the exam and  reviewed the chart and it is accurate to the best of my knowledge. Julieanne Manson M.D. [facility]

## 2016-08-11 ENCOUNTER — Telehealth: Payer: Self-pay

## 2016-08-11 NOTE — Telephone Encounter (Signed)
Patient called to be seen today, next available appointment was with Adriana at 4 pm. Patient is complaining of chest pain.  Triaged:Per patient her chest pain started last night. Is mild dull. It feels heavy "like someone is sitting" she reports the pain radiates to her shoulder blades. Is a uncomfortable feeling in chest.She reports that she does have SOB-but that she usually has it. She also said yes to jaw pain-but once again she said she has been having left jaw pain.Patient denies discomfort in either arm, neck,nausea,cold sweats or lightheadedness, she is feeling dizzy.no numbness in face, arms,legs. Per Adriana patient needs to go the ER to be avalulated. Patient agrees.  Thanks,  -Amyrah Pinkhasov

## 2016-10-08 LAB — HM DIABETES EYE EXAM

## 2016-11-10 ENCOUNTER — Ambulatory Visit: Payer: Managed Care, Other (non HMO) | Admitting: Family Medicine

## 2016-12-09 ENCOUNTER — Ambulatory Visit: Payer: Managed Care, Other (non HMO) | Admitting: Family Medicine

## 2016-12-21 ENCOUNTER — Encounter: Payer: Self-pay | Admitting: Family Medicine

## 2016-12-21 ENCOUNTER — Ambulatory Visit (INDEPENDENT_AMBULATORY_CARE_PROVIDER_SITE_OTHER): Payer: Managed Care, Other (non HMO) | Admitting: Family Medicine

## 2016-12-21 VITALS — BP 138/80 | HR 84 | Temp 98.0°F | Resp 16 | Wt 221.0 lb

## 2016-12-21 DIAGNOSIS — R002 Palpitations: Secondary | ICD-10-CM

## 2016-12-21 DIAGNOSIS — E7800 Pure hypercholesterolemia, unspecified: Secondary | ICD-10-CM | POA: Diagnosis not present

## 2016-12-21 DIAGNOSIS — R102 Pelvic and perineal pain: Secondary | ICD-10-CM

## 2016-12-21 DIAGNOSIS — I10 Essential (primary) hypertension: Secondary | ICD-10-CM

## 2016-12-21 DIAGNOSIS — E119 Type 2 diabetes mellitus without complications: Secondary | ICD-10-CM

## 2016-12-21 DIAGNOSIS — R1032 Left lower quadrant pain: Secondary | ICD-10-CM | POA: Diagnosis not present

## 2016-12-21 LAB — POCT URINALYSIS DIPSTICK
Bilirubin, UA: NEGATIVE
GLUCOSE UA: NEGATIVE
Ketones, UA: NEGATIVE
LEUKOCYTES UA: NEGATIVE — AB
NITRITE UA: NEGATIVE
Protein, UA: NEGATIVE
RBC UA: NEGATIVE
Spec Grav, UA: 1.015 (ref 1.010–1.025)
UROBILINOGEN UA: 0.2 U/dL
pH, UA: 6 (ref 5.0–8.0)

## 2016-12-21 MED ORDER — CIPROFLOXACIN HCL 500 MG PO TABS: 500.0000 mg | ORAL_TABLET | Freq: Two times a day (BID) | ORAL | 0 refills | Status: DC

## 2016-12-21 NOTE — Progress Notes (Signed)
[name] Female    DOB: [birth year]   61 y.o.   MRN: [id number] Visit Date: 12/21/2016  Today's Provider: [name], MD   Chief Complaint  Patient presents with  . Depression  . Hypertension  . Urinary Tract Infection   Subjective:    HPI      Diabetes Mellitus Type II, Follow-up:   Lab Results  Component Value Date   HGBA1C 8.9 (H) 04/07/2016   HGBA1C 8.9 (H) 01/07/2016   HGBA1C 8.2 08/27/2015    Last seen for diabetes 6 months ago.  Management since then includes providing pt with lab slip to check A1C at work. She reports poor compliance with treatment. Pt forgot to get this checked, and when she did remember she realized she lost the lab slip. She is not having side effects.  Current symptoms include none and have been stable. Home blood sugar records: not being checked  Episodes of hypoglycemia? no   Current Insulin Regimen: N/A Most Recent Eye Exam: January 2018. Dr. Dellie Burns. 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 ------------------------------------------------------------------------  Hypertension, follow-up:  BP Readings from Last 3 Encounters:  12/21/16 138/80  07/12/16 (!) 144/68  04/01/16 134/62    She was last seen for hypertension 6 months ago.  BP at that visit was 144/68. Management since that visit includes none. She reports excellent compliance with treatment. She is not having side effects.  She is not exercising. She is not adherent to  low salt diet.   Outside blood pressures are mostly normal, but has been as high as 170/80's. She is experiencing dyspnea, fatigue and irregular heart beat.  Patient denies chest pain, chest pressure/discomfort, claudication, lower extremity edema, near-syncope and syncope.   Cardiovascular risk factors include diabetes mellitus, hypertension and obesity (BMI >= 30 kg/m2).      Weight trend: increasing steadily Wt Readings from Last 3 Encounters:  12/21/16 221 lb (100.2 kg)  07/12/16 219 lb (99.3 kg)  04/01/16 215 lb (97.5 kg)    Current diet: well balanced  ------------------------------------------------------------------------ Pt also c/o urinary sx, including pelvic "pressure" that radiates to left groin, urethral pain, and urinary frequency.  Allergies  Allergen Reactions  . Darvon [Propoxyphene]   . Liraglutide   . Toradol [Ketorolac Tromethamine] Other (See Comments)    Vomiting, severe lower back pain/cramping and abdomen area.  . Vioxx [Rofecoxib] Other (See Comments)  . Xopenex [Levalbuterol]   . Hydrocodone-Acetaminophen Itching and Rash    Per patient     Current Outpatient Prescriptions:  .  escitalopram (LEXAPRO) 20 MG tablet, Take 1 tablet (20 mg total) by mouth daily., Disp: 90 tablet, Rfl: 3 .  glucose blood (ACCU-CHEK COMPACT STRIPS) test strip, Check sugar once daily-need QS 30 day supply. DX E11.9, Disp: 100 each, Rfl: 12 .  glucose blood (ONETOUCH VERIO) test strip, Use as instructed, Disp: 100 each, Rfl: 12 .  losartan (COZAAR) 25 MG  tablet, Take 1 tablet (25 mg total) by mouth daily., Disp: 30 tablet, Rfl: 12 .  omeprazole (PRILOSEC) 40 MG capsule, Take 1 capsule (40 mg total) by mouth daily., Disp: 90 capsule, Rfl: 3 .  oxyCODONE (OXY IR/ROXICODONE) 5 MG immediate release tablet, Take 1 tablet (5 mg total) by mouth every 4 (four) hours as needed for severe pain., Disp: 100 tablet, Rfl: 0 .  PENNSAID 2 % SOLN, APPLY 2 PUMPS (2 GRAMS) TO AFFECTED AREA  TOPICALLY TWICE DAILY AS DIRECTED, Disp: 112 g, Rfl: 5 .  sitaGLIPtin-metformin (JANUMET) 50-1000 MG tablet, Take 1 tablet by mouth 2 (two) times daily with a meal., Disp: 180 tablet, Rfl: 3 .  Verapamil HCl CR 300 MG CP24, Take 1 capsule (300 mg total) by mouth daily., Disp: 90 each, Rfl: 3 .  meclizine (ANTIVERT) 25 MG tablet, Take by mouth. Reported on 04/01/2016, Disp: , Rfl:  .  traZODone (DESYREL) 100 MG tablet, Take 1 tablet (100 mg total) by mouth at bedtime. (Patient not taking: Reported on 12/21/2016), Disp: 30 tablet, Rfl: 6  Review of Systems  Constitutional: Positive for fatigue. Negative for activity change, appetite change, chills, diaphoresis, fever and unexpected weight change.  Cardiovascular: Positive for chest pain (  sharp midline chest pain that occured 3 times over the last 2 months. ) and palpitations ("skips a beat"). Negative for leg swelling.  Endocrine: Negative.   Genitourinary: Positive for frequency.  Allergic/Immunologic: Negative.   Neurological: Negative.   Hematological: Negative.   Psychiatric/Behavioral: Negative.     Social History  Substance Use Topics  . Smoking status: Never Smoker  . Smokeless tobacco: Never Used  . Alcohol use No   Objective:   BP 138/80 (BP Location: Left Arm, Patient Position: Sitting, Cuff Size: Large)   Pulse 84   Temp 98 F (36.7 C) (Oral)   Resp 16   Wt 221 lb (100.2 kg)   SpO2 96%   BMI 36.78 kg/m  Vitals:   12/21/16 1549  BP: 138/80  Pulse: 84  Resp: 16  Temp: 98 F (36.7 C)  TempSrc: Oral  SpO2: 96%  Weight: 221 lb (100.2 kg)     Physical Exam  Constitutional: She is oriented to person, place, and time. She appears well-developed and well-nourished.  HENT:  Head: Normocephalic and atraumatic.  Right Ear: External ear normal.  Left Ear: External ear normal.  Nose: Nose normal.  Eyes: Conjunctivae are normal. No scleral icterus.  Neck: Neck supple.  Cardiovascular: Normal rate, regular rhythm and  normal heart sounds.   Pulmonary/Chest: Effort normal and breath sounds normal. No respiratory distress.  Abdominal: Soft. There is tenderness (LLQ).  Genitourinary:  Genitourinary Comments: Mild vaginal atrophy.  Neurological: She is alert and oriented to person, place, and time.  Skin: Skin is warm and dry.  Psychiatric: She has a normal mood and affect. Her behavior is normal. Judgment and thought content normal.        Assessment & Plan:     1. Type 2 diabetes mellitus without complication, without long-term current use of insulin (HCC)  - CBC with Differential/Platelet - Comprehensive metabolic panel - Hemoglobin A1c  2. Essential (primary) hypertension   3. Pelvic pressure in female  - POCT urinalysis dipstick - Urine culture Results for orders placed or performed in visit on 12/21/16  POCT urinalysis dipstick  Result Value Ref Range   Color, UA yellow    Clarity, UA clear    Glucose, UA Negative  Bilirubin, UA Negative    Ketones, UA Negative    Spec Grav, UA 1.015 1.010 - 1.025   Blood, UA Negative    pH, UA 6.0 5.0 - 8.0   Protein, UA Negative    Urobilinogen, UA 0.2 0.2 or 1.0 E.U./dL   Nitrite, UA Negative    Leukocytes, UA Negative (A) small (1+)     4. Heart palpitations  - EKG 12-Lead  5. Pure hypercholesterolemia  - Lipid panel - TSH  6. LLQ pain  - US Abdomen Complete; Future - ciprofloxacin (CIPRO) 500 MG tablet; Take 1 tablet (500 mg total) by mouth 2 (two) times daily.  Dispense: 20 tablet; Refill: 0 - US Transvaginal Non-OB; Future     Patient seen and examined by Julieanne Manson, MD, and note scribed by Allene Dillon, CMA.  Richard Wendelyn Breslow, MD  [facility]

## 2016-12-23 LAB — URINE CULTURE: Organism ID, Bacteria: NO GROWTH

## 2016-12-23 NOTE — Progress Notes (Signed)
Advised  ED 

## 2016-12-24 ENCOUNTER — Other Ambulatory Visit: Payer: Self-pay

## 2016-12-24 DIAGNOSIS — Z299 Encounter for prophylactic measures, unspecified: Secondary | ICD-10-CM

## 2016-12-24 LAB — HEPATIC FUNCTION PANEL: BILIRUBIN, TOTAL: 0.2

## 2016-12-24 LAB — BASIC METABOLIC PANEL
Creatinine: 0.6 (ref 0.5–1.1)
Glucose: 225

## 2016-12-24 LAB — LIPID PANEL
CHOLESTEROL: 172 (ref 0–200)
HDL: 43 (ref 35–70)
LDL Cholesterol: 90
LDl/HDL Ratio: 4

## 2016-12-24 LAB — CBC AND DIFFERENTIAL
HCT: 37 (ref 36–46)
Hemoglobin: 11.5 — AB (ref 12.0–16.0)
Platelets: 231 (ref 150–399)

## 2016-12-24 LAB — TSH: TSH: 3.78 (ref 0.41–5.90)

## 2016-12-24 NOTE — Progress Notes (Signed)
Patient came in to have blood drawn for testing per Dr. Gilbert's orders. 

## 2016-12-25 ENCOUNTER — Ambulatory Visit: Payer: Managed Care, Other (non HMO) | Admitting: Family Medicine

## 2016-12-25 LAB — CMP12+LP+TP+TSH+6AC+CBC/D/PLT
A/G RATIO: 2 (ref 1.2–2.2)
ALT: 29 IU/L (ref 0–32)
AST: 31 IU/L (ref 0–40)
Albumin: 4.4 g/dL (ref 3.6–4.8)
Alkaline Phosphatase: 80 IU/L (ref 39–117)
BASOS ABS: 0 10*3/uL (ref 0.0–0.2)
BILIRUBIN TOTAL: 0.2 mg/dL (ref 0.0–1.2)
BUN / CREAT RATIO: 13 (ref 12–28)
BUN: 8 mg/dL (ref 8–27)
Basos: 0 %
CHOL/HDL RATIO: 4 ratio (ref 0.0–4.4)
CREATININE: 0.61 mg/dL (ref 0.57–1.00)
Calcium: 9.3 mg/dL (ref 8.7–10.3)
Chloride: 99 mmol/L (ref 96–106)
Cholesterol, Total: 172 mg/dL (ref 100–199)
EOS (ABSOLUTE): 0.2 10*3/uL (ref 0.0–0.4)
EOS: 3 %
Estimated CHD Risk: 0.9 times avg. (ref 0.0–1.0)
Free Thyroxine Index: 1.7 (ref 1.2–4.9)
GFR, EST AFRICAN AMERICAN: 112 mL/min/{1.73_m2} (ref >59)
GFR, EST NON AFRICAN AMERICAN: 97 mL/min/{1.73_m2} (ref >59)
GGT: 34 IU/L (ref 0–60)
GLOBULIN, TOTAL: 2.2 g/dL (ref 1.5–4.5)
GLUCOSE: 225 mg/dL — AB (ref 65–99)
HDL: 43 mg/dL (ref >39)
HEMOGLOBIN: 11.5 g/dL (ref 11.1–15.9)
Hematocrit: 36.6 % (ref 34.0–46.6)
IMMATURE GRANS (ABS): 0 10*3/uL (ref 0.0–0.1)
Immature Granulocytes: 0 %
Iron: 45 ug/dL (ref 27–139)
LDH: 149 IU/L (ref 119–226)
LDL Calculated: 90 mg/dL (ref 0–99)
LYMPHS: 34 %
Lymphocytes Absolute: 2.5 10*3/uL (ref 0.7–3.1)
MCH: 25.8 pg — ABNORMAL LOW (ref 26.6–33.0)
MCHC: 31.4 g/dL — ABNORMAL LOW (ref 31.5–35.7)
MCV: 82 fL (ref 79–97)
MONOCYTES: 9 %
Monocytes Absolute: 0.7 10*3/uL (ref 0.1–0.9)
Neutrophils Absolute: 4 10*3/uL (ref 1.4–7.0)
Neutrophils: 54 %
PLATELETS: 231 10*3/uL (ref 150–379)
Phosphorus: 3.8 mg/dL (ref 2.5–4.5)
Potassium: 4.8 mmol/L (ref 3.5–5.2)
RBC: 4.46 x10E6/uL (ref 3.77–5.28)
RDW: 14.8 % (ref 12.3–15.4)
Sodium: 140 mmol/L (ref 134–144)
T3 UPTAKE RATIO: 20 % — AB (ref 24–39)
T4, Total: 8.5 ug/dL (ref 4.5–12.0)
TRIGLYCERIDES: 197 mg/dL — AB (ref 0–149)
TSH: 3.78 u[IU]/mL (ref 0.450–4.500)
Total Protein: 6.6 g/dL (ref 6.0–8.5)
URIC ACID: 3.2 mg/dL (ref 2.5–7.1)
VLDL CHOLESTEROL CAL: 39 mg/dL (ref 5–40)
WBC: 7.4 10*3/uL (ref 3.4–10.8)

## 2016-12-25 LAB — HGB A1C W/O EAG: HEMOGLOBIN A1C: 9.8 % — AB (ref 4.8–5.6)

## 2016-12-27 NOTE — Progress Notes (Signed)
Advised  ED 

## 2016-12-28 ENCOUNTER — Ambulatory Visit: Payer: Managed Care, Other (non HMO)

## 2016-12-29 ENCOUNTER — Ambulatory Visit: Payer: Managed Care, Other (non HMO) | Admitting: Family Medicine

## 2017-01-18 ENCOUNTER — Other Ambulatory Visit: Payer: Self-pay | Admitting: Family Medicine

## 2017-01-18 ENCOUNTER — Other Ambulatory Visit: Payer: Self-pay

## 2017-01-18 MED ORDER — LOSARTAN POTASSIUM 25 MG PO TABS: 25.0000 mg | ORAL_TABLET | Freq: Every day | ORAL | 3 refills | Status: DC

## 2017-01-18 MED ORDER — LOSARTAN POTASSIUM 25 MG PO TABS
25.0000 mg | ORAL_TABLET | Freq: Every day | ORAL | 3 refills | Status: DC
Start: 2017-01-18 — End: 2017-01-18

## 2017-01-18 NOTE — Telephone Encounter (Signed)
Done-aa 

## 2017-01-18 NOTE — Telephone Encounter (Signed)
[facility] faxed a request on the following medication. Thanks CC   losartan (COZAAR) 25 MG tablet  Take one tablet each day.

## 2017-02-18 ENCOUNTER — Encounter: Payer: Self-pay | Admitting: Physician Assistant

## 2017-02-18 ENCOUNTER — Ambulatory Visit: Payer: Self-pay | Admitting: Physician Assistant

## 2017-02-18 VITALS — BP 140/70 | HR 75 | Temp 98.5°F | Resp 16

## 2017-02-18 DIAGNOSIS — W57XXXA Bitten or stung by nonvenomous insect and other nonvenomous arthropods, initial encounter: Principal | ICD-10-CM

## 2017-02-18 DIAGNOSIS — S30861A Insect bite (nonvenomous) of abdominal wall, initial encounter: Secondary | ICD-10-CM

## 2017-02-18 MED ORDER — DOXYCYCLINE HYCLATE 100 MG PO TABS: 100.0000 mg | ORAL_TABLET | Freq: Two times a day (BID) | ORAL | 0 refills | Status: DC

## 2017-02-18 NOTE — Progress Notes (Signed)
   Subjective:Multiple Tick bites.    Patient ID: Sarah Carson, female    DOB: August 11, 1956, 61 y.o.   MRN: [id number]  HPI Patient has multiple Ticks bites over last month. Denies rash but unsure of arthralgia secondary to having Arthritis. No palliative measure for compliant.   Review of Systems    Negative Objective:   Physical Exam No obvious rash at bite sites.       Assessment & Plan:Tick bite  Doxycycline BID for 10 days. Follow up with PCP as needed.

## 2017-03-28 ENCOUNTER — Ambulatory Visit (INDEPENDENT_AMBULATORY_CARE_PROVIDER_SITE_OTHER): Payer: Managed Care, Other (non HMO) | Admitting: Family Medicine

## 2017-03-28 VITALS — BP 156/62 | HR 100 | Resp 16 | Wt 218.0 lb

## 2017-03-28 DIAGNOSIS — E119 Type 2 diabetes mellitus without complications: Secondary | ICD-10-CM | POA: Diagnosis not present

## 2017-03-28 DIAGNOSIS — F330 Major depressive disorder, recurrent, mild: Secondary | ICD-10-CM

## 2017-03-28 DIAGNOSIS — Z5320 Procedure and treatment not carried out because of patient's decision for unspecified reasons: Secondary | ICD-10-CM

## 2017-03-28 DIAGNOSIS — K219 Gastro-esophageal reflux disease without esophagitis: Secondary | ICD-10-CM | POA: Diagnosis not present

## 2017-03-28 DIAGNOSIS — Z1159 Encounter for screening for other viral diseases: Secondary | ICD-10-CM

## 2017-03-28 DIAGNOSIS — I10 Essential (primary) hypertension: Secondary | ICD-10-CM | POA: Diagnosis not present

## 2017-03-28 DIAGNOSIS — Z6836 Body mass index (BMI) 36.0-36.9, adult: Secondary | ICD-10-CM

## 2017-03-28 MED ORDER — LOSARTAN POTASSIUM 50 MG PO TABS: 50.0000 mg | ORAL_TABLET | Freq: Every day | ORAL | 3 refills | Status: DC

## 2017-03-28 NOTE — Progress Notes (Signed)
Sarah Carson  MRN: [id number] DOB: [birth year]  Subjective:  HPI  Patient is here for 3 months follow up.  Last office visit was on 12/21/2016 and patient had routine lab work done through work. Patient would like to have A1C checked through work. Patient has not been checking her sugar at home. Patient has been checking b/p sometimes and she is getting some elevated readings. Wt Readings from Last 3 Encounters:  03/28/17 218 lb (98.9 kg)  12/21/16 221 lb (100.2 kg)  07/12/16 219 lb (99.3 kg)   Lab Results  Component Value Date   HGBA1C 9.8 (H) 12/24/2016   BP Readings from Last 3 Encounters:  03/28/17 (!) 156/62  02/18/17 140/70  12/21/16 138/80   Patient would like to have left breast looked at-skin has some redness in that area. Also wants to have her lower back looked at. In April she had about 4 tick bites. In the right lower back she one day then scratched a spot and felt something come off and then there was drainage from the spot in her back. Then she started to have sensitivity in that area like anything touching it, even slightly touching it. It took a while before it got better and it felt like possible singles sensation from what she heard from her husband in the past when described the sensation. She did see employee clinic provider and was treated with Doxy but did not address possibility of shingles.  Patient Active Problem List   Diagnosis Date Noted  . Dermatitis 08/27/2015  . 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 Cardiac dysrhythmia 04/23/2015  . Arthritis 04/23/2015  . Airway hyperreactivity 04/23/2015  . Carpal tunnel syndrome 04/23/2015  . Cellulitis 04/23/2015  . Bronchitis, chronic (HCC) 04/23/2015  . Diabetes mellitus, type 2 (HCC) 04/23/2015  . Blood pressure elevated 04/23/2015  . Essential (primary) hypertension 04/23/2015  . Fibrositis 04/23/2015  . Anxiety, generalized 04/23/2015  . Gastro-esophageal reflux disease without esophagitis 04/23/2015    . Herniation of nucleus pulposus 04/23/2015  . HLD (hyperlipidemia) 04/23/2015  . Hypersomnia with sleep apnea 04/23/2015  . 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 Adult BMI 30+ 04/23/2015  . 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 Knee torn cartilage 04/23/2015    No past medical history on file.  Social History   Social History  . Marital status: Married    Spouse name: [name]  . Number of children: 2  . Years of education: college   Occupational History  . office manager    Social History Main Topics  . Smoking status: Never Smoker  . Smokeless tobacco: Never Used  . Alcohol use No  . Drug use: No  . Sexual activity: Not Currently   Other Topics Concern  . Not on file   Social History Narrative  . No narrative on file    Outpatient Encounter Prescriptions as of 03/28/2017  Medication Sig Note  . escitalopram (LEXAPRO) 20 MG tablet Take 1 tablet (20 mg total) by mouth daily.   Marland Kitchen glucose blood (ACCU-CHEK COMPACT STRIPS) test strip Check sugar once daily-need QS 30 day supply. DX E11.9   . glucose blood (ONETOUCH VERIO) test strip Use as instructed   . losartan (COZAAR) 25 MG tablet Take 1 tablet (25 mg total) by mouth daily.   Marland Kitchen omeprazole (PRILOSEC) 40 MG capsule Take 1 capsule (40 mg total) by mouth daily.   . sitaGLIPtin-metformin (JANUMET) 50-1000 MG tablet Take 1 tablet by mouth 2 (two) times  daily with a meal.   . Verapamil HCl CR 300 MG CP24 Take 1 capsule (300 mg total) by mouth daily.   . meclizine (ANTIVERT) 25 MG tablet Take by mouth. Reported on 04/01/2016 04/23/2015: Medication taken as needed.  Received from: [facility]  . oxyCODONE (OXY IR/ROXICODONE) 5 MG immediate release tablet Take 1 tablet (5 mg total) by mouth every 4 (four) hours as needed for severe pain. (Patient not taking: Reported on 03/28/2017) 04/01/2016: As needed  . PENNSAID 2 % SOLN APPLY 2 PUMPS (2 GRAMS) TO AFFECTED AREA TOPICALLY TWICE  DAILY AS DIRECTED (Patient not taking: Reported on 03/28/2017)   . [DISCONTINUED] ciprofloxacin (CIPRO) 500 MG tablet Take 1 tablet (500 mg total) by mouth 2 (two) times daily.   . [DISCONTINUED] doxycycline (VIBRA-TABS) 100 MG tablet Take 1 tablet (100 mg total) by mouth 2 (two) times daily.   . [DISCONTINUED] traZODone (DESYREL) 100 MG tablet Take 1 tablet (100 mg total) by mouth at bedtime. (Patient not taking: Reported on 03/28/2017) 04/01/2016: 1/2 tablet as needed   No facility-administered encounter medications on file as of 03/28/2017.     Allergies  Allergen Reactions  . Darvon [Propoxyphene]   . Liraglutide   . Toradol [Ketorolac Tromethamine] Other (See Comments)    Vomiting, severe lower back pain/cramping and abdomen area.  . Vioxx [Rofecoxib] Other (See Comments)  . Xopenex [Levalbuterol]   . Hydrocodone-Acetaminophen Itching and Rash    Per patient    Review of Systems  Constitutional: Positive for malaise/fatigue.  Respiratory: Negative.   Cardiovascular: Negative.   Gastrointestinal: Negative.   Musculoskeletal: Positive for joint pain.  Skin: Positive for rash.  Neurological: Negative.   Endo/Heme/Allergies: Negative.   Psychiatric/Behavioral: Negative.        Stable.    Objective:  BP (!) 156/62   Pulse 100   Resp 16   Wt 218 lb (98.9 kg)   BMI 36.28 kg/m   Physical Exam  Constitutional: She is oriented to person, place, and time and well-developed, well-nourished, and in no distress.  HENT:  Head: Normocephalic and atraumatic.  Right Ear: External ear normal.  Left Ear: External ear normal.  Eyes: Pupils are equal, round, and reactive to light. Conjunctivae are normal.  Neck: Normal range of motion. Neck supple.  Cardiovascular: Normal rate, regular rhythm, normal heart sounds and intact distal pulses.  Exam reveals no gallop.   No murmur heard. Pulmonary/Chest: Effort normal and breath sounds normal. No respiratory distress. She has no wheezes.    Abdominal: Soft.  Neurological: She is alert and oriented to person, place, and time. Gait normal. GCS score is 15.  Skin: Skin is warm and dry.  Psychiatric: Mood, memory, affect and judgment normal.   Assessment and Plan :  1. Type 2 diabetes mellitus without complication, without long-term current use of insulin (HCC) Lab slip given to patient to get done through work. Advised patient to check sugar once daily. - HgB A1c  2. Essential (primary) hypertension B/P elevated. Increase Losartan to 50 mg daily. Re check on the next visit.  3. Need for hepatitis C screening test - Hepatitis C Antibody  4. Gastro-esophageal reflux disease without esophagitis Stable.  5. Depression, major, recurrent, mild (HCC) Stable.  6. BMI 36.0-36.9,adult Discussed with patient working on habits.  HPI, Exam and A&P transcribed by Samara Deist, RMA under direction and in the presence of Julieanne Manson, MD.

## 2017-04-01 ENCOUNTER — Other Ambulatory Visit: Payer: Self-pay | Admitting: Physician Assistant

## 2017-04-01 ENCOUNTER — Other Ambulatory Visit: Payer: Self-pay

## 2017-04-01 DIAGNOSIS — Z299 Encounter for prophylactic measures, unspecified: Secondary | ICD-10-CM

## 2017-04-01 NOTE — Progress Notes (Signed)
Patient came in to have blood drawn for testing per Dr. Gilbert's orders. 

## 2017-04-02 LAB — HCV COMMENT:

## 2017-04-02 LAB — HEPATITIS C ANTIBODY (REFLEX)

## 2017-04-02 LAB — HGB A1C W/O EAG: HEMOGLOBIN A1C: 9.4 % — AB (ref 4.8–5.6)

## 2017-04-25 ENCOUNTER — Other Ambulatory Visit: Payer: Self-pay | Admitting: Family Medicine

## 2017-04-25 DIAGNOSIS — E119 Type 2 diabetes mellitus without complications: Secondary | ICD-10-CM

## 2017-04-25 MED ORDER — SITAGLIPTIN PHOS-METFORMIN HCL 50-1000 MG PO TABS: 1.0000 | ORAL_TABLET | Freq: Two times a day (BID) | ORAL | 3 refills | Status: DC

## 2017-04-25 NOTE — Telephone Encounter (Signed)
rx sent

## 2017-04-25 NOTE — Telephone Encounter (Signed)
[facility] faxed refill request for the following medications: sitaGLIPtin-metformin (JANUMET) 50-1000 MG tablet 90 day supply  Last Rx: 04/28/16 LOV: 03/28/17 Please advise. Thanks TNP

## 2017-05-23 ENCOUNTER — Other Ambulatory Visit: Payer: Self-pay | Admitting: Family Medicine

## 2017-05-23 MED ORDER — OMEPRAZOLE 40 MG PO CPDR: 40.0000 mg | DELAYED_RELEASE_CAPSULE | Freq: Every day | ORAL | 3 refills | Status: DC

## 2017-05-23 MED ORDER — VERAPAMIL HCL ER 300 MG PO CP24: 300.0000 mg | ORAL_CAPSULE | Freq: Every day | ORAL | 3 refills | Status: DC

## 2017-05-23 MED ORDER — ESCITALOPRAM OXALATE 20 MG PO TABS
20.0000 mg | ORAL_TABLET | Freq: Every day | ORAL | 3 refills | Status: DC
Start: 2017-05-23 — End: 2018-05-15

## 2017-05-23 NOTE — Telephone Encounter (Signed)
Done-Anastasiya V Hopkins, RMA  

## 2017-05-23 NOTE — Telephone Encounter (Addendum)
[facility] faxed a refill request on the following medications:  Verapamil HCl CR 300 MG CP24.  Take one capsule by mouth daily.  90 day supply.    omeprazole (PRILOSEC) 40 MG capsule.  Take one capsule by mouth daily.  90 day supply.  escitalopram (LEXAPRO) 20 MG tablet.  Take one tablet by mouth every day.  90 day supply.  [facility]

## 2017-05-23 NOTE — Addendum Note (Signed)
Addended by: Consuella Lose on: 05/23/2017 04:18 PM   Modules accepted: Orders

## 2017-06-20 ENCOUNTER — Telehealth: Payer: Self-pay | Admitting: Family Medicine

## 2017-06-20 NOTE — Telephone Encounter (Signed)
Pt states the pharmacy gave her a new glucose meter.  Pt is requesting a Rx for the test stripes.  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  [facility].  JQ#[phone]/[id number]

## 2017-06-20 NOTE — Telephone Encounter (Signed)
Ok to send into the pharmacy? Please advise.

## 2017-06-21 MED ORDER — GLUCOSE BLOOD VI STRP
ORAL_STRIP | 12 refills | Status: DC
Start: 2017-06-21 — End: 2021-02-24

## 2017-06-21 NOTE — Telephone Encounter (Signed)
ok 

## 2017-07-06 ENCOUNTER — Ambulatory Visit: Payer: Managed Care, Other (non HMO) | Admitting: Family Medicine

## 2017-07-27 ENCOUNTER — Ambulatory Visit: Payer: Managed Care, Other (non HMO) | Admitting: Family Medicine

## 2017-07-27 ENCOUNTER — Other Ambulatory Visit: Payer: Self-pay

## 2017-07-27 VITALS — BP 138/74 | HR 83 | Resp 16 | Wt 216.0 lb

## 2017-07-27 DIAGNOSIS — E119 Type 2 diabetes mellitus without complications: Secondary | ICD-10-CM | POA: Diagnosis not present

## 2017-07-27 DIAGNOSIS — I10 Essential (primary) hypertension: Secondary | ICD-10-CM

## 2017-07-27 NOTE — Progress Notes (Signed)
Sarah Carson  MRN: [id number] DOB: Feb 05, 1956  Subjective:  HPI   The patient is a 62 year old female who presents for follow up of her chronic issues.  The patient was last seen on 03/28/17.  Diabetes-The patient had her last A1C on 04/01/17 and it was 9.4.  The patient checks her glucose at home and reports it ranging 160-220.  She has not had any hypoglycemic symptoms.  She is up to date on her diabetic eye exam but does need to have diabetic foot exam and urine micro-albumin.  The patient gets her labs free through work and would like to take an order sheet and have them done there.    Hypertension-On her last visit the patient was instructed to increase her Losartan to 50 mg daily.  Her blood pressure was not been at goal on that last visit. The patient asked about a Losartan recall.     BP Readings from Last 3 Encounters:  07/27/17 138/74  03/28/17 (!) 156/62  02/18/17 140/70    Patient Active Problem List   Diagnosis Date Noted  . Dermatitis 08/27/2015  . 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 Cardiac dysrhythmia 04/23/2015  . Arthritis 04/23/2015  . Airway hyperreactivity 04/23/2015  . Carpal tunnel syndrome 04/23/2015  . Cellulitis 04/23/2015  . Bronchitis, chronic (HCC) 04/23/2015  . Diabetes mellitus, type 2 (HCC) 04/23/2015  . Blood pressure elevated 04/23/2015  . Essential (primary) hypertension 04/23/2015  . Fibrositis 04/23/2015  . Anxiety, generalized 04/23/2015  . Gastro-esophageal reflux disease without esophagitis 04/23/2015  . Herniation of nucleus pulposus 04/23/2015  . HLD (hyperlipidemia) 04/23/2015  . Hypersomnia with sleep apnea 04/23/2015  . 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 BMI 30.0-30.9,adult 04/23/2015  . 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 Knee torn cartilage 04/23/2015    No past medical history on file.  Social History   Socioeconomic History  . Marital status: Married    Spouse name: [name]  .  Number of children: 2  . Years of education: college  . Highest education level: Not on file  Social Needs  . Financial resource strain: Not on file  . Food insecurity - worry: Not on file  . Food insecurity - inability: Not on file  . Transportation needs - medical: Not on file  . Transportation needs - non-medical: Not on file  Occupational History  . Occupation: Print production planner  Tobacco Use  . Smoking status: Never Smoker  . Smokeless tobacco: Never Used  Substance and Sexual Activity  . Alcohol use: No    Alcohol/week: 0.0 oz  . Drug use: No  . Sexual activity: Not Currently  Other Topics Concern  . Not on file  Social History Narrative  . Not on file    Outpatient Encounter Medications as of 07/27/2017  Medication Sig Note  . escitalopram (LEXAPRO) 20 MG tablet Take 1 tablet (20 mg total) by mouth daily.   Marland Kitchen glucose blood (ACCU-CHEK COMPACT STRIPS) test strip Check sugar once daily-need QS 30 day supply. DX E11.9   . glucose blood (ONE TOUCH ULTRA TEST) test strip Check sugar once daily. DX E11.9   . glucose blood (ONETOUCH VERIO) test strip Use as instructed   . losartan (COZAAR) 50 MG tablet Take 1 tablet (50 mg total) by mouth daily.   . meclizine (ANTIVERT) 25 MG tablet Take by mouth. Reported on 04/01/2016 04/23/2015: Medication taken as needed.  Received from: [facility]  . omeprazole (PRILOSEC) 40 MG  capsule Take 1 capsule (40 mg total) by mouth daily.   Marland Kitchen oxyCODONE (OXY IR/ROXICODONE) 5 MG immediate release tablet Take 1 tablet (5 mg total) by mouth every 4 (four) hours as needed for severe pain. 04/01/2016: As needed  . PENNSAID 2 % SOLN APPLY 2 PUMPS (2 GRAMS) TO AFFECTED AREA TOPICALLY TWICE DAILY AS DIRECTED   . sitaGLIPtin-metformin (JANUMET) 50-1000 MG tablet Take 1 tablet by mouth 2 (two) times daily with a meal.   . Verapamil HCl CR 300 MG CP24 Take 1 capsule (300 mg total) by mouth daily.   . Verapamil HCl CR 300 MG CP24 Take 1 capsule (300  mg total) by mouth daily.   . Verapamil HCl CR 300 MG CP24 Take 1 capsule (300 mg total) by mouth daily.    No facility-administered encounter medications on file as of 07/27/2017.     Allergies  Allergen Reactions  . Darvon [Propoxyphene]   . Liraglutide   . Toradol [Ketorolac Tromethamine] Other (See Comments)    Vomiting, severe lower back pain/cramping and abdomen area.  . Vioxx [Rofecoxib] Other (See Comments)  . Xopenex [Levalbuterol]   . Hydrocodone-Acetaminophen Itching and Rash    Per patient    Review of Systems  Constitutional: Negative for fever and malaise/fatigue.  HENT: Negative.   Eyes: Negative.   Respiratory: Negative for cough, shortness of breath and wheezing.   Cardiovascular: Negative for chest pain, palpitations, orthopnea and claudication.  Gastrointestinal: Negative.   Skin: Negative.   Neurological: Negative.  Negative for weakness.  Endo/Heme/Allergies: Positive for environmental allergies.  Psychiatric/Behavioral: Negative.     Objective:  BP 138/74 (BP Location: Right Arm, Patient Position: Sitting, Cuff Size: Normal)   Pulse 83   Resp 16   Wt 216 lb (98 kg)   BMI 35.94 kg/m   Physical Exam  Constitutional: She is oriented to person, place, and time and well-developed, well-nourished, and in no distress.  HENT:  Head: Normocephalic and atraumatic.  Eyes: Conjunctivae are normal. No scleral icterus.  Neck: No thyromegaly present.  Cardiovascular: Normal rate, regular rhythm and normal heart sounds.  Pulmonary/Chest: Effort normal and breath sounds normal.  Abdominal: Soft.  Neurological: She is alert and oriented to person, place, and time. Gait normal. GCS score is 15.  Skin: Skin is warm and dry.  Psychiatric: Mood, memory, affect and judgment normal.    Assessment and Plan :  TIIDM HLD HTN MDD Fair control. Check labs.  I have done the exam and reviewed the chart and it is accurate to the best of my knowledge. Forensic scientist has been used and  any errors in dictation or transcription are unintentional. Julieanne Manson M.D. [facility]

## 2017-08-01 ENCOUNTER — Other Ambulatory Visit: Payer: Self-pay | Admitting: Physician Assistant

## 2017-08-01 DIAGNOSIS — Z299 Encounter for prophylactic measures, unspecified: Secondary | ICD-10-CM

## 2017-08-01 LAB — HM DIABETES EYE EXAM

## 2017-08-01 NOTE — Progress Notes (Signed)
Patient came in to have blood drawn for testing per Dr. Gerlene Burdock Gilbert's orders.

## 2017-08-02 LAB — MICROALBUMIN / CREATININE URINE RATIO
Creatinine, Urine: 82.9 mg/dL
Microalb/Creat Ratio: 4.5 mg/g creat (ref 0.0–30.0)
Microalbumin, Urine: 3.7 ug/mL

## 2017-08-02 LAB — HGB A1C W/O EAG: Hgb A1c MFr Bld: 8.8 % — ABNORMAL HIGH (ref 4.8–5.6)

## 2017-08-02 NOTE — Progress Notes (Signed)
Here is a copy of your patient's labs that were drawn in the employee clinic 

## 2017-11-23 ENCOUNTER — Ambulatory Visit: Payer: Managed Care, Other (non HMO) | Admitting: Family Medicine

## 2017-11-24 ENCOUNTER — Ambulatory Visit: Payer: Self-pay | Admitting: Family Medicine

## 2017-11-30 ENCOUNTER — Encounter: Payer: Self-pay | Admitting: Family Medicine

## 2017-11-30 ENCOUNTER — Ambulatory Visit: Payer: Managed Care, Other (non HMO) | Admitting: Family Medicine

## 2017-11-30 VITALS — BP 120/64 | HR 70 | Temp 98.1°F | Resp 16 | Wt 214.2 lb

## 2017-11-30 DIAGNOSIS — I10 Essential (primary) hypertension: Secondary | ICD-10-CM

## 2017-11-30 DIAGNOSIS — E7800 Pure hypercholesterolemia, unspecified: Secondary | ICD-10-CM | POA: Diagnosis not present

## 2017-11-30 DIAGNOSIS — Z1239 Encounter for other screening for malignant neoplasm of breast: Secondary | ICD-10-CM

## 2017-11-30 DIAGNOSIS — E119 Type 2 diabetes mellitus without complications: Secondary | ICD-10-CM

## 2017-11-30 DIAGNOSIS — Z1231 Encounter for screening mammogram for malignant neoplasm of breast: Secondary | ICD-10-CM

## 2017-11-30 NOTE — Progress Notes (Signed)
Patient ID: Sarah Carson, female   DOB: 1956/08/17, 62 y.o.   MRN: [id number]       [name] Female    DOB: 12-05-1955   62 y.o.   MRN: [id number] Visit Date: 11/30/2017  Today's Provider: [name], MD   Chief Complaint  Patient presents with  . Diabetes  . Hypertension  . Hyperlipidemia   Subjective:    HPI      Diabetes Mellitus Type II, Follow-up:   Lab Results  Component Value Date   HGBA1C 8.8 (H) 08/01/2017   HGBA1C 9.4 (H) 04/01/2017   HGBA1C 9.8 (H) 12/24/2016   Last seen for diabetes 3 months ago.  Management since then includes none. She reports good compliance with treatment. She is not having side effects.  Current symptoms include none and have been stable. Home blood sugar records: 170s  Episodes of hypoglycemia? no   Current Insulin Regimen:   Most Recent Eye Exam: 10/2017 Weight trend: stable Prior visit with dietician: yes -  Current diet: in general, a "healthy" diet   Current exercise: walking  ------------------------------------------------------------------------   Hypertension, follow-up:  BP Readings from Last 3 Encounters:  11/30/17 120/64  07/27/17 138/74  03/28/17 (!) 156/62    She was last seen for hypertension 3 months ago.  BP at that visit was 156/62. Management since that visit includes none.She reports excellent compliance with treatment. She is not having side effects.  She is exercising. She is adherent to low salt diet.   Outside blood pressures are stable. She is experiencing irregular heart beat but patient states that she has cut out caffeine and it has helped tremendously.  Patient denies chest pain, chest pressure/discomfort, claudication, dyspnea, exertional chest pressure/discomfort, fatigue, lower extremity edema, near-syncope, orthopnea, palpitations, paroxysmal nocturnal dyspnea, syncope and tachypnea.   Cardiovascular risk factors include advanced age (older than 45 for men, 44 for  women), diabetes mellitus and hypertension.  Use of agents associated with hypertension: none.   ------------------------------------------------------------------------    Lipid/Cholesterol, Follow-up:   Last seen for this 3 months ago.  Management since that visit includes improving diet.  Last Lipid Panel:    Component Value Date/Time   CHOL 172 12/24/2016 0936   TRIG 197 (H) 12/24/2016 0936   HDL 43 12/24/2016 0936   CHOLHDL 4.0 12/24/2016 0936   LDLCALC 90 12/24/2016 0936       Wt Readings from Last 3 Encounters:  11/30/17 214 lb 3.2 oz (97.2 kg)  07/27/17 216 lb (98 kg)  03/28/17 218 lb (98.9 kg)    ------------------------------------------------------------------------   Allergies  Allergen Reactions  . Darvon [Propoxyphene]   . Liraglutide   . Toradol [Ketorolac Tromethamine] Other (See Comments)    Vomiting, severe lower back pain/cramping and abdomen area.  . Vioxx [Rofecoxib] Other (See Comments)  . Xopenex [Levalbuterol]   . Hydrocodone-Acetaminophen Itching and Rash    Per patient     Current Outpatient Medications:  .  escitalopram (LEXAPRO) 20 MG tablet, Take 1 tablet (20 mg total) by mouth daily., Disp: 90 tablet, Rfl: 3 .  glucose blood (ACCU-CHEK COMPACT STRIPS) test strip, Check sugar once daily-need QS 30 day supply. DX E11.9, Disp: 100 each, Rfl: 12 .  glucose blood (ONE TOUCH ULTRA TEST) test strip, Check sugar once daily. DX E11.9, Disp: 100 each, Rfl: 12 .  glucose blood (ONETOUCH VERIO) test strip, Use as instructed, Disp: 100 each, Rfl: 12 .  losartan (COZAAR) 50 MG tablet, Take 1 tablet (  50 mg total) by mouth daily., Disp: 90 tablet, Rfl: 3 .  omeprazole (PRILOSEC) 40 MG capsule, Take 1 capsule (40 mg total) by mouth daily., Disp: 90 capsule, Rfl: 3 .  PENNSAID 2 % SOLN, APPLY 2 PUMPS (2 GRAMS) TO AFFECTED AREA TOPICALLY TWICE DAILY AS DIRECTED, Disp: 112 g, Rfl: 5 .  sitaGLIPtin-metformin (JANUMET) 50-1000 MG tablet, Take 1 tablet by  mouth 2 (two) times daily with a meal., Disp: 180 tablet, Rfl: 3 .  Verapamil HCl CR 300 MG CP24, Take 1 capsule (300 mg total) by mouth daily., Disp: 90 each, Rfl: 3 .  Verapamil HCl CR 300 MG CP24, Take 1 capsule (300 mg total) by mouth daily., Disp: 90 each, Rfl: 3 .  Verapamil HCl CR 300 MG CP24, Take 1 capsule (300 mg total) by mouth daily., Disp: 90 each, Rfl: 3  Review of Systems  Constitutional: Negative.   HENT: Negative.   Eyes: Negative.   Respiratory: Negative.   Cardiovascular: Negative.   Gastrointestinal: Negative.   Endocrine: Negative.   Genitourinary: Negative.   Musculoskeletal: Negative.   Allergic/Immunologic: Negative.   Psychiatric/Behavioral: Negative.     Social History   Tobacco Use  . Smoking status: Never Smoker  . Smokeless tobacco: Never Used  Substance Use Topics  . Alcohol use: No    Alcohol/week: 0.0 oz   Objective:   BP 120/64   Pulse 70   Temp 98.1 F (36.7 C) (Oral)   Resp 16   Wt 214 lb 3.2 oz (97.2 kg)   SpO2 97%   BMI 35.64 kg/m  Vitals:   11/30/17 1343  BP: 120/64  Pulse: 70  Resp: 16  Temp: 98.1 F (36.7 C)  TempSrc: Oral  SpO2: 97%  Weight: 214 lb 3.2 oz (97.2 kg)     Physical Exam  Constitutional: She is oriented to person, place, and time. She appears well-developed.  HENT:  Head: Normocephalic and atraumatic.  Eyes: No scleral icterus.  Neck: Normal range of motion. No thyromegaly present.  Cardiovascular: Normal rate, regular rhythm and normal heart sounds.  Pulmonary/Chest: Effort normal and breath sounds normal.  Musculoskeletal: Normal range of motion.  Neurological: She is alert and oriented to person, place, and time.  Skin: Skin is warm and dry.  Psychiatric: She has a normal mood and affect. Her behavior is normal. Judgment and thought content normal.  Vitals reviewed.       Assessment & Plan:     1. Type 2 diabetes mellitus without complication, without long-term current use of insulin  (HCC)  - HgB A1c - CBC w/Diff/Platelet - Comprehensive Metabolic Panel (CMET)  2. Pure hypercholesterolemia  - Lipid Profile  3. Essential (primary) hypertension  - TSH  4. Screening for breast cancer Needs CPE also. - MM Digital Diagnostic Bilat; Future      I have done the exam and reviewed the above chart and it is accurate to the best of my knowledge. Dentist has been used in this note in any air is in the dictation or transcription are unintentional.  [name], MD  [facility]

## 2017-12-09 ENCOUNTER — Other Ambulatory Visit: Payer: Self-pay

## 2017-12-09 ENCOUNTER — Telehealth: Payer: Self-pay | Admitting: Family Medicine

## 2017-12-09 DIAGNOSIS — Z1239 Encounter for other screening for malignant neoplasm of breast: Secondary | ICD-10-CM

## 2017-12-09 NOTE — Telephone Encounter (Signed)
Sorry, Sarah Carson was working with [place] and she is not used to ordering things.  I have placed a new order.  It is supposed to be screening. Thanks

## 2017-12-09 NOTE — Telephone Encounter (Signed)
Order was placed in Epic for diagnostic mammogram but diagnosis is screening.Does pt need diagnostic ?

## 2018-01-27 ENCOUNTER — Telehealth: Payer: Self-pay | Admitting: Family Medicine

## 2018-01-27 NOTE — Telephone Encounter (Signed)
Pt states she would like to go ahead and change her Losartan mediation to another brand due to recall.  Pt would like the next RX be sent to [facility] in [place].

## 2018-01-30 NOTE — Telephone Encounter (Signed)
Please advise. Thanks.  

## 2018-01-31 NOTE — Telephone Encounter (Signed)
1 daily--lowest dose ok for now--thanks

## 2018-01-31 NOTE — Telephone Encounter (Signed)
Directions and quantity? Thanks! :-)

## 2018-01-31 NOTE — Telephone Encounter (Signed)
Valsartan ok

## 2018-02-01 ENCOUNTER — Other Ambulatory Visit: Payer: Self-pay

## 2018-02-01 MED ORDER — VALSARTAN 40 MG PO TABS: 40.0000 mg | ORAL_TABLET | Freq: Every day | ORAL | 12 refills | Status: DC

## 2018-03-02 ENCOUNTER — Encounter: Payer: Self-pay | Admitting: Family Medicine

## 2018-03-13 ENCOUNTER — Telehealth: Payer: Self-pay | Admitting: Family Medicine

## 2018-03-13 NOTE — Telephone Encounter (Signed)
Patient states that 2 weeks ago she dropped a heavy piece of wood on her toes.  She thinks her right second toe is broken and the one next to it injured.  She said that swelling has gone down but last night her foot brushed on a pillow and she had excrutiating pain in the toes.  She was advised to keep the appointment tomorrow and we would check it and see if it needed to be x-rayed.

## 2018-03-13 NOTE — Telephone Encounter (Signed)
Pt is requesting Michelle Nasuti return her call. Pt stated that she thinks she may have broken her toe and with her being a diabetic she wasn't sure if she should come in and have Dr. Sullivan Lone look at it or treat at home. Pt went ahead and scheduled OV with Dr. Sullivan Lone at 220 pm 03/14/18 but still request Michelle Nasuti return her call. Please advise. Thanks TNP

## 2018-03-14 ENCOUNTER — Ambulatory Visit: Payer: Managed Care, Other (non HMO) | Admitting: Family Medicine

## 2018-03-23 ENCOUNTER — Encounter: Payer: Self-pay | Admitting: Podiatry

## 2018-03-23 ENCOUNTER — Encounter

## 2018-03-23 ENCOUNTER — Other Ambulatory Visit: Payer: Self-pay | Admitting: Podiatry

## 2018-03-23 ENCOUNTER — Ambulatory Visit: Payer: Managed Care, Other (non HMO) | Admitting: Podiatry

## 2018-03-23 ENCOUNTER — Ambulatory Visit (INDEPENDENT_AMBULATORY_CARE_PROVIDER_SITE_OTHER): Payer: Managed Care, Other (non HMO)

## 2018-03-23 VITALS — BP 139/71 | HR 69

## 2018-03-23 DIAGNOSIS — T1490XA Injury, unspecified, initial encounter: Secondary | ICD-10-CM

## 2018-03-23 DIAGNOSIS — S93601A Unspecified sprain of right foot, initial encounter: Secondary | ICD-10-CM

## 2018-03-23 DIAGNOSIS — T148XXA Other injury of unspecified body region, initial encounter: Secondary | ICD-10-CM

## 2018-03-23 NOTE — Progress Notes (Signed)
This patient presents to the office with chief complaint of a painful right foot.   This patient admits that she has dropped a piece of wood on her right forefoot approximately 4 weeks ago.  She says that she attempted to live with it, but the pain has started to worsen.  She now walks with a limp and with the recommendation of her sister she presents to the office for an evaluation and treatment.  She has provided no self treatment nor sought any professional help.  She presents the office today for an evaluation and treatment of her painful right forefoot. Patient is a type 2 diabetic.  General Appearance  Alert, conversant and in no acute stress.  Vascular  Dorsalis pedis and posterior tibial  pulses are palpable  bilaterally.  Capillary return is within normal limits  bilaterally. Temperature is within normal limits  bilaterally.  Neurologic  Senn-Weinstein monofilament wire test within normal limits  bilaterally. Muscle power within normal limits bilaterally.  Nails normal nails noted with no evidence of bacterial or fungal infection  Orthopedic  No limitations of motion of motion feet .  No crepitus or effusions noted.  No bony pathology or digital deformities noted. Patient has palpable pain noted at the PIPJ second digit right foot.  Patient has pain at the second MPJ of the right foot. Upon dorsiflexion second toe.  Patient has palpable pain noted midshaft of the second, third and fourth metatarsals right foot.  There is no evidence of any swelling or ecchymosis.  Increased temperature is noted in the right forefoot at the level of the MP J.  Midfoot arthritis  B/l.  Skin  normotropic skin with no porokeratosis noted bilaterally.  No signs of infections or ulcers noted.   Foot Sprain right foot  Fracture second toe right foot.   IE  AP lateral and oblique x-rays taken reveal a fracture at the head of the proximal phalanx second digit right foot.  Arthritis noted at the IPJ of the right  hallux.  No evidence of any bony pathology noted through the metatarsals right foot. Discussed this condition with this patient.  She describes the injury and this has led to an antalgic gait and guarding of the right forefoot.  Since there is no bony pathology seen on x-ray. I recommended she ambulate with a surgical shoe to break the guarding of the right forefoot.  Patient states she was pain free, removing the office wearing the surgical shoe.  She was told to return to the office when necessary   Helane Gunther DPM

## 2018-03-30 ENCOUNTER — Ambulatory Visit: Payer: PRIVATE HEALTH INSURANCE | Admitting: Podiatry

## 2018-04-25 ENCOUNTER — Ambulatory Visit: Payer: Self-pay | Admitting: Family Medicine

## 2018-04-25 VITALS — BP 145/71 | HR 87 | Temp 98.9°F | Resp 18

## 2018-04-25 DIAGNOSIS — H6993 Unspecified Eustachian tube disorder, bilateral: Secondary | ICD-10-CM

## 2018-04-25 DIAGNOSIS — H6983 Other specified disorders of Eustachian tube, bilateral: Secondary | ICD-10-CM

## 2018-04-25 DIAGNOSIS — R05 Cough: Secondary | ICD-10-CM

## 2018-04-25 DIAGNOSIS — E7800 Pure hypercholesterolemia, unspecified: Secondary | ICD-10-CM

## 2018-04-25 DIAGNOSIS — I10 Essential (primary) hypertension: Secondary | ICD-10-CM

## 2018-04-25 DIAGNOSIS — E118 Type 2 diabetes mellitus with unspecified complications: Secondary | ICD-10-CM

## 2018-04-25 DIAGNOSIS — Z299 Encounter for prophylactic measures, unspecified: Secondary | ICD-10-CM

## 2018-04-25 DIAGNOSIS — R059 Cough, unspecified: Secondary | ICD-10-CM

## 2018-04-25 MED ORDER — BENZONATATE 200 MG PO CAPS: 200.0000 mg | ORAL_CAPSULE | Freq: Every evening | ORAL | 0 refills | Status: DC | PRN

## 2018-04-25 MED ORDER — FLUTICASONE PROPIONATE 50 MCG/ACT NA SUSP: 2.0000 | Freq: Every day | NASAL | 0 refills | Status: DC

## 2018-04-25 NOTE — Progress Notes (Signed)
Subjective: congestion and cough      Sarah Carson is a 62 y.o. female who presents for evaluation of nasal congestion, productive cough, low-grade fever, and sore throat for 2 days.  Patient also endorses ear fullness and soreness at the submandibular angle bilaterally.  Crackling sensation in her ears when she swallows.  Denies ear pain.  Patient reports the sore throat resolved this morning.  Patient endorses a history of a chronic productive cough for the last year related to chronic bronchitis but that this is different, patient states "this is more of a cough you get with a cold".  Patient endorses mild wheezing for the last 2 nights.  No shortness of breath.  Patient reports the reason she presented to care today was that she needed to get lab work done and figured she'd get her "cold checked out ".  Patient also endorses night sweats for the last 6 months.  Patient has not checked her blood sugar at night but reports elevated blood sugars first thing in the morning despite eating a healthy diet and being compliant with treatment.  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  Denies weight loss or any other symptoms. Treatment to date: Denies any.  Denies rash, nausea, vomiting, diarrhea, SOB, chest or back pain, difficulty swallowing, confusion, anosmia/hyposmia, dental pain, facial pressure/unilateral facial pain, pain exacerbated by bending over, headache, body aches, fatigue, fever, chills, severe symptoms, or initial improvement and then worsening of symptoms.  Review of Systems Pertinent items noted in HPI and remainder of comprehensive ROS otherwise negative.     Objective:   Physical Exam General: Awake, alert, and oriented. No acute distress. Well developed, hydrated and nourished. Appears stated age. Nontoxic appearance.  HEENT:  PND noted.  Mild erythema to posterior oropharynx.  No edema or exudates of pharynx or  tonsils. No erythema, opaque fluid, or bulging of TM.  Air/fluid level noted to bilateral tympanic membranes, clear fluid present, TMs full bilaterally.  Mild erythema/edema to nasal mucosa. Sinuses nontender. Supple neck without adenopathy. Cardiac: Heart rate and rhythm are normal. No murmurs, gallops, or rubs are auscultated. S1 and S2 are heard and are of normal intensity.  Respiratory: No signs of respiratory distress. Lungs clear. No tachypnea. Able to speak in full sentences without dyspnea. Nonlabored respirations.  Skin: Skin is warm, dry and intact. Appropriate color for ethnicity. No cyanosis noted.    Assessment:    viral upper respiratory illness   Eustachian tube dysfunction - bilateral  Plan:    Discussed diagnosis and treatment of URI. Discussed the importance of avoiding unnecessary antibiotic therapy. Suggested symptomatic OTC remedies. Nasal saline spray for congestion.   Prescribed Flonase for eustachian tube dysfunction and educated patient regarding use of this.  Patient denies any history of glaucoma or ocular history.  Patient has taken this in the past and tolerated it well. Prescribed Tessalon Perles to use as needed at night for cough. Discussed treatment options for wheezing, patient declines any of these at this time.  Patient reports wheezing has been mild and stable/improving. PPD placed today.  Patient to return to clinic on Thursday to evaluate results. Advised patient to take her blood sugar at night when she experiences night sweat symptoms.  Advised to report abnormal values to her primary care provider. Discussed side/adverse effects of all medications prescribed. Updated Dr. Sullivan Lone regarding patient's symptoms and treatment plan.  Advised patient to follow-up with Dr. Sullivan Lone  within the next week. Discussed red flag symptoms and circumstances with which to seek medical care.   New Prescriptions   BENZONATATE (TESSALON) 200 MG CAPSULE    Take 1 capsule  (200 mg total) by mouth at bedtime as needed for cough.   FLUTICASONE (FLONASE) 50 MCG/ACT NASAL SPRAY    Place 2 sprays into both nostrils daily.

## 2018-04-26 ENCOUNTER — Telehealth: Payer: Self-pay

## 2018-04-26 LAB — COMPREHENSIVE METABOLIC PANEL
ALK PHOS: 79 IU/L (ref 39–117)
ALT: 29 IU/L (ref 0–32)
AST: 29 IU/L (ref 0–40)
Albumin/Globulin Ratio: 2 (ref 1.2–2.2)
Albumin: 4.7 g/dL (ref 3.6–4.8)
BILIRUBIN TOTAL: 0.3 mg/dL (ref 0.0–1.2)
BUN/Creatinine Ratio: 18 (ref 12–28)
BUN: 10 mg/dL (ref 8–27)
CHLORIDE: 100 mmol/L (ref 96–106)
CO2: 21 mmol/L (ref 20–29)
CREATININE: 0.55 mg/dL — AB (ref 0.57–1.00)
Calcium: 9.5 mg/dL (ref 8.7–10.3)
GFR calc Af Amer: 115 mL/min/{1.73_m2} (ref >59)
GFR calc non Af Amer: 99 mL/min/{1.73_m2} (ref >59)
GLUCOSE: 165 mg/dL — AB (ref 65–99)
Globulin, Total: 2.4 g/dL (ref 1.5–4.5)
Potassium: 4.5 mmol/L (ref 3.5–5.2)
Sodium: 139 mmol/L (ref 134–144)
TOTAL PROTEIN: 7.1 g/dL (ref 6.0–8.5)

## 2018-04-26 LAB — LIPID PANEL
Chol/HDL Ratio: 3.8 ratio (ref 0.0–4.4)
Cholesterol, Total: 160 mg/dL (ref 100–199)
HDL: 42 mg/dL (ref >39)
LDL Calculated: 88 mg/dL (ref 0–99)
TRIGLYCERIDES: 152 mg/dL — AB (ref 0–149)
VLDL CHOLESTEROL CAL: 30 mg/dL (ref 5–40)

## 2018-04-26 LAB — CBC WITH DIFFERENTIAL/PLATELET
BASOS ABS: 0 10*3/uL (ref 0.0–0.2)
Basos: 0 %
EOS (ABSOLUTE): 0.2 10*3/uL (ref 0.0–0.4)
Eos: 2 %
HEMOGLOBIN: 11.8 g/dL (ref 11.1–15.9)
Hematocrit: 36.2 % (ref 34.0–46.6)
IMMATURE GRANS (ABS): 0 10*3/uL (ref 0.0–0.1)
Immature Granulocytes: 0 %
LYMPHS: 22 %
Lymphocytes Absolute: 1.7 10*3/uL (ref 0.7–3.1)
MCH: 26.3 pg — AB (ref 26.6–33.0)
MCHC: 32.6 g/dL (ref 31.5–35.7)
MCV: 81 fL (ref 79–97)
MONOCYTES: 15 %
Monocytes Absolute: 1.2 10*3/uL — ABNORMAL HIGH (ref 0.1–0.9)
NEUTROS ABS: 4.8 10*3/uL (ref 1.4–7.0)
Neutrophils: 61 %
PLATELETS: 240 10*3/uL (ref 150–450)
RBC: 4.49 x10E6/uL (ref 3.77–5.28)
RDW: 15.1 % (ref 12.3–15.4)
WBC: 7.9 10*3/uL (ref 3.4–10.8)

## 2018-04-26 LAB — TSH: TSH: 1.67 u[IU]/mL (ref 0.450–4.500)

## 2018-04-26 LAB — HGB A1C W/O EAG: Hgb A1c MFr Bld: 8.8 % — ABNORMAL HIGH (ref 4.8–5.6)

## 2018-04-26 NOTE — Telephone Encounter (Signed)
Patient called office requesting that Dr. Sullivan Lone review over labs she had drawn yesterday.

## 2018-04-26 NOTE — Progress Notes (Signed)
Results documented in error.  Patient will return to clinic tomorrow to have TB skin test read.

## 2018-04-27 LAB — TB SKIN TEST
Induration: 0 mm
TB Skin Test: NEGATIVE

## 2018-04-27 NOTE — Telephone Encounter (Signed)
I believe these are labs someone else ordered.

## 2018-05-15 ENCOUNTER — Other Ambulatory Visit: Payer: Self-pay | Admitting: Family Medicine

## 2018-05-15 DIAGNOSIS — E119 Type 2 diabetes mellitus without complications: Secondary | ICD-10-CM

## 2018-06-10 ENCOUNTER — Other Ambulatory Visit: Payer: Self-pay | Admitting: Family Medicine

## 2018-08-03 ENCOUNTER — Telehealth: Payer: Self-pay | Admitting: Family Medicine

## 2018-08-03 NOTE — Telephone Encounter (Signed)
no

## 2018-08-03 NOTE — Telephone Encounter (Signed)
Pt requesting lab work prior to appt on 08/28/18.  Requesting call back when orders are in.

## 2018-08-03 NOTE — Telephone Encounter (Signed)
Ok to order 

## 2018-08-04 NOTE — Telephone Encounter (Signed)
Patient was advised.  

## 2018-08-09 ENCOUNTER — Encounter: Payer: Self-pay | Admitting: Family Medicine

## 2018-08-09 ENCOUNTER — Other Ambulatory Visit: Payer: Self-pay

## 2018-08-28 ENCOUNTER — Other Ambulatory Visit: Payer: Self-pay

## 2018-08-28 ENCOUNTER — Ambulatory Visit: Payer: Managed Care, Other (non HMO) | Admitting: Family Medicine

## 2018-08-28 ENCOUNTER — Encounter: Payer: Self-pay | Admitting: Family Medicine

## 2018-08-28 VITALS — BP 128/62 | Temp 98.6°F | Resp 16 | Ht 65.0 in | Wt 194.0 lb

## 2018-08-28 DIAGNOSIS — I10 Essential (primary) hypertension: Secondary | ICD-10-CM | POA: Diagnosis not present

## 2018-08-28 DIAGNOSIS — Z0000 Encounter for general adult medical examination without abnormal findings: Secondary | ICD-10-CM

## 2018-08-28 DIAGNOSIS — M25541 Pain in joints of right hand: Secondary | ICD-10-CM

## 2018-08-28 DIAGNOSIS — E7800 Pure hypercholesterolemia, unspecified: Secondary | ICD-10-CM | POA: Diagnosis not present

## 2018-08-28 DIAGNOSIS — E119 Type 2 diabetes mellitus without complications: Secondary | ICD-10-CM

## 2018-08-28 DIAGNOSIS — E6609 Other obesity due to excess calories: Secondary | ICD-10-CM

## 2018-08-28 DIAGNOSIS — Z6832 Body mass index (BMI) 32.0-32.9, adult: Secondary | ICD-10-CM

## 2018-08-28 LAB — POCT UA - MICROALBUMIN: MICROALBUMIN (UR) POC: 50 mg/L

## 2018-08-28 LAB — POCT GLYCOSYLATED HEMOGLOBIN (HGB A1C): Hemoglobin A1C: 7.5 % — AB (ref 4.0–5.6)

## 2018-08-28 NOTE — Progress Notes (Signed)
[name], Female    DOB: 1956/05/14, 62 y.o.   MRN: [id number] Visit Date: 08/28/2018  Today's Provider: [name], MD   Chief Complaint  Patient presents with  . Annual Exam   Subjective:    Annual physical exam Sarah Carson is a 62 y.o. female who presents today for health maintenance and complete physical. She feels well. She reports exercising not regularly. She reports she is sleeping well.  Colonoscopy- 12/06/2012. Normal. Repeat in 10 years. Mammogram- 11/23/2012. Calcifications in right breast. Biopsy was normal. Repeat annually.  S/P hysterectomy.     Review of Systems  Constitutional: Negative.   HENT: Negative.   Eyes: Negative.   Respiratory: Negative.   Cardiovascular: Negative.   Gastrointestinal: Negative.   Endocrine: Negative.   Genitourinary: Negative.   Musculoskeletal: Positive for arthralgias.       Right hand stiffness and weakness.  Skin: Negative.   Neurological: Positive for numbness.       She describes a recent months tenderness to any touch of the metacarpal pads of her right hand.  Hematological: Negative.   Psychiatric/Behavioral: Negative.     Social History      She  reports that she has never smoked. She has never used smokeless tobacco. She reports that she does not drink alcohol or use drugs.       Social History   Socioeconomic History  . Marital status: Married    Spouse name: [name]  . Number of children: 2  . Years of education: college  . Highest education level: Not on file  Occupational History  . Occupation: Print production planner  Social Needs  . Financial resource strain: Not on file  . Food insecurity:    Worry: Not on file    Inability: Not on file  . Transportation needs:    Medical: Not on file    Non-medical: Not on file  Tobacco Use  . Smoking status: Never Smoker  . Smokeless tobacco: Never Used  Substance and Sexual Activity  . Alcohol use: No    Alcohol/week: 0.0 standard  drinks  . Drug use: No  . Sexual activity: Not Currently  Lifestyle  . Physical activity:    Days per week: Not on file    Minutes per session: Not on file  . Stress: Not on file  Relationships  . Social connections:    Talks on phone: Not on file    Gets together: Not on file    Attends religious service: Not on file    Active member of club or organization: Not on file    Attends meetings of clubs or organizations: Not on file    Relationship status: Not on file  Other Topics Concern  . Not on file  Social History Narrative  . Not on file    No past medical history on file.   Patient Active Problem List   Diagnosis Date Noted  . Dermatitis 08/27/2015  . 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 Cardiac dysrhythmia 04/23/2015  . Arthritis 04/23/2015  . Airway hyperreactivity 04/23/2015  . Carpal tunnel syndrome 04/23/2015  . Cellulitis 04/23/2015  . Bronchitis, chronic (HCC) 04/23/2015  . Diabetes mellitus, type 2 (HCC) 04/23/2015  . Blood pressure elevated 04/23/2015  . Essential (primary) hypertension 04/23/2015  . Fibrositis 04/23/2015  . Anxiety, generalized 04/23/2015  . Gastro-esophageal reflux disease without esophagitis 04/23/2015  . Herniation of nucleus pulposus 04/23/2015  . HLD (hyperlipidemia) 04/23/2015  . 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 BMI 30.0-30.9,adult 04/23/2015  . 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 Knee torn cartilage 04/23/2015    Past Surgical History:  Procedure Laterality Date  . ABDOMINAL HYSTERECTOMY    . BREAST SURGERY    . CHOLECYSTECTOMY    . KNEE ARTHROSCOPY    . RIGHT OOPHORECTOMY    . TONSILLECTOMY      Family History        Family Status  Relation Name Status  . Mother  Alive  . Father  Deceased  . Sister  Alive  . Sister  Alive  . GChild  Deceased at age Teenager       Drug overdose  . MGM  (Not Specified)  . MGF  (Not Specified)    . PGF  (Not Specified)        Her family history includes Alcohol abuse in her father; Arthritis in her maternal grandfather and mother; Cancer in her paternal grandfather; Diabetes in her maternal grandmother and mother; Heart disease in her father and mother; Hypertension in her mother; Neuromuscular disorder in her sister and sister.      Allergies  Allergen Reactions  . Darvon [Propoxyphene]   . Ketorolac Other (See Comments)    Vomiting, severe lower back pain/cramping and abdomen area.  . Liraglutide   . Toradol [Ketorolac Tromethamine] Other (See Comments)    Vomiting, severe lower back pain/cramping and abdomen area.  . Vioxx [Rofecoxib] Other (See Comments)  . Xopenex [Levalbuterol]   . Hydrocodone-Acetaminophen Itching and Rash    Per patient  . Hydrocodone-Acetaminophen Itching and Rash    Per patient     Current Outpatient Medications:  .  benzonatate (TESSALON) 200 MG capsule, Take 1 capsule (200 mg total) by mouth at bedtime as needed for cough. (Patient not taking: Reported on 08/28/2018), Disp: 20 capsule, Rfl: 0 .  escitalopram (LEXAPRO) 20 MG tablet, TAKE ONE (1) TABLET EACH DAY, Disp: 90 tablet, Rfl: 4 .  fluticasone (FLONASE) 50 MCG/ACT nasal spray, Place 2 sprays into both nostrils daily., Disp: 16 g, Rfl: 0 .  glucose blood (ACCU-CHEK COMPACT STRIPS) test strip, Check sugar once daily-need QS 30 day supply. DX E11.9, Disp: 100 each, Rfl: 12 .  glucose blood (ONE TOUCH ULTRA TEST) test strip, Check sugar once daily. DX E11.9, Disp: 100 each, Rfl: 12 .  glucose blood (ONETOUCH VERIO) test strip, Use as instructed, Disp: 100 each, Rfl: 12 .  JANUMET 50-1000 MG tablet, TAKE ONE TABLET TWICE A DAY WITH FOOD, Disp: 60 tablet, Rfl: 11 .  losartan (COZAAR) 50 MG tablet, Take 1 tablet (50 mg total) by mouth daily., Disp: 90 tablet, Rfl: 3 .  omeprazole (PRILOSEC) 40 MG capsule, TAKE ONE (1) CAPSULE EACH DAY, Disp: 30 capsule, Rfl: 11 .  Verapamil HCl CR 300 MG CP24,  Take 1 capsule (300 mg total) by mouth daily., Disp: 90 each, Rfl: 3 .  Verapamil HCl CR 300 MG CP24, Take 1 capsule (300 mg total) by mouth daily., Disp: 90 each, Rfl: 3 .  Verapamil HCl CR 300 MG CP24, Take 1 capsule (300 mg total) by mouth daily., Disp: 90 each, Rfl: 3 .  Verapamil HCl CR 300 MG CP24, TAKE ONE (1) CAPSULE EACH DAY, Disp: 30 capsule, Rfl: 11   Patient Care Team: Maple Hudson., MD as PCP - General (Family Medicine)      Objective:   Vitals: BP 128/62  Temp 98.6 F (37 C)   Resp 16   Ht 5\' 5"  (1.651 m)   Wt 194 lb (88 kg)   BMI 32.28 kg/m    Vitals:   08/28/18 1443  BP: 128/62  Resp: 16  Temp: 98.6 F (37 C)  Weight: 194 lb (88 kg)  Carson: 5\' 5"  (1.651 m)     Physical Exam Vitals signs reviewed. Exam conducted with a chaperone present (Normal breast exam).  Constitutional:      Appearance: Normal appearance. She is well-developed. She is obese.  HENT:     Head: Normocephalic and atraumatic.     Right Ear: External ear normal.     Left Ear: External ear normal.     Mouth/Throat:     Pharynx: Oropharynx is clear.  Eyes:     General: No scleral icterus. Neck:     Musculoskeletal: Normal range of motion.     Thyroid: No thyromegaly.  Cardiovascular:     Rate and Rhythm: Normal rate and regular rhythm.     Heart sounds: Normal heart sounds.  Pulmonary:     Effort: Pulmonary effort is normal.     Breath sounds: Normal breath sounds.  Abdominal:     Palpations: Abdomen is soft.  Musculoskeletal: Normal range of motion.     Comments: No obvious swelling of the hand but she has decreased strength in the right hand.  She is sensitive palmar surface of the hand over the metacarpal pad area the second through the fourth fingers.  Skin:    General: Skin is warm and dry.  Neurological:     General: No focal deficit present.     Mental Status: She is alert and oriented to person, place, and time. Mental status is at baseline.  Psychiatric:         Mood and Affect: Mood normal.        Behavior: Behavior normal.        Thought Content: Thought content normal.        Judgment: Judgment normal.      Depression Screen PHQ 2/9 Scores 07/27/2017 04/01/2016  PHQ - 2 Score 0 1  PHQ- 9 Score 0 2      Assessment & Plan:     Routine Health Maintenance and Physical Exam  Exercise Activities and Dietary recommendations Goals   None     Immunization History  Administered Date(s) Administered  . PPD Test 04/25/2018  . Tdap 03/21/2007  . Zoster 07/22/2011    Health Maintenance  Topic Date Due  . FOOT EXAM  02/16/1964  . HIV Screening  02/15/1969  . PAP SMEAR-Modifier  02/16/1975  . MAMMOGRAM  04/26/2013  . TETANUS/TDAP  03/20/2017  . INFLUENZA VACCINE  04/13/2018  . OPHTHALMOLOGY EXAM  08/01/2018  . HEMOGLOBIN A1C  10/26/2018  . COLONOSCOPY  12/07/2022  . Hepatitis C Screening  Completed     Discussed health benefits of physical activity, and encouraged her to engage in regular exercise appropriate for her age and condition.  1. Annual physical exam Patient is noncompliant with routine follow-up.pap not needed. Colonoscopy 2024.  2. Type 2 diabetes mellitus without complication, without long-term current use of insulin (HCC) No changes today and diabetic care.  Lifestyle is stressed - POCT glycosylated hemoglobin (Hb A1C)--7.5 today - POCT UA - Microalbumin  3. Essential (primary) hypertension Controlled  4. Arthralgia of hand, right Patient is losing function in her right hand and I do not think this is a systemic problem.  Refer to hand surgery. - Ambulatory referral to Orthopedic Surgery  5. Pure hypercholesterolemia   6. Class 1 obesity due to excess calories with serious comorbidity and body mass index (BMI) of 32.0 to 32.9 in adult Patient with diabetes, hypertension ,hyperlipidemia     I have done the exam and reviewed the above chart and it is accurate to the best of my knowledge. Forensic scientist has been used in this note in any air is in the dictation or transcription are unintentional.  [name], MD  [facility]

## 2018-10-13 ENCOUNTER — Ambulatory Visit (INDEPENDENT_AMBULATORY_CARE_PROVIDER_SITE_OTHER): Payer: Managed Care, Other (non HMO) | Admitting: Physician Assistant

## 2018-10-13 ENCOUNTER — Encounter: Payer: Self-pay | Admitting: Physician Assistant

## 2018-10-13 VITALS — BP 153/78 | HR 87 | Temp 98.1°F | Resp 16 | Wt 201.0 lb

## 2018-10-13 DIAGNOSIS — R059 Cough, unspecified: Secondary | ICD-10-CM

## 2018-10-13 DIAGNOSIS — J209 Acute bronchitis, unspecified: Secondary | ICD-10-CM

## 2018-10-13 DIAGNOSIS — R05 Cough: Secondary | ICD-10-CM | POA: Diagnosis not present

## 2018-10-13 DIAGNOSIS — R6889 Other general symptoms and signs: Secondary | ICD-10-CM | POA: Diagnosis not present

## 2018-10-13 DIAGNOSIS — J440 Chronic obstructive pulmonary disease with acute lower respiratory infection: Secondary | ICD-10-CM

## 2018-10-13 LAB — POCT INFLUENZA A/B
INFLUENZA B, POC: NEGATIVE
Influenza A, POC: NEGATIVE

## 2018-10-13 MED ORDER — AZITHROMYCIN 250 MG PO TABS: ORAL_TABLET | ORAL | 0 refills | Status: DC

## 2018-10-13 MED ORDER — BENZONATATE 200 MG PO CAPS: 200.0000 mg | ORAL_CAPSULE | Freq: Two times a day (BID) | ORAL | 0 refills | Status: DC | PRN

## 2018-10-13 NOTE — Progress Notes (Signed)
[name] Female    DOB: 02/01/1956   63 y.o.   MRN: [id number] Visit Date: 10/13/2018  Today's Provider: Margaretann LovelessJennifer M , PA-C   Chief Complaint  Patient presents with  . Nasal Congestion   Subjective:     Cough  This is a new problem. The current episode started today. The problem has been unchanged. The cough is productive of sputum. Associated symptoms include ear congestion, nasal congestion, rhinorrhea, shortness of breath and wheezing. Pertinent negatives include no chest pain, chills, ear pain, fever, headaches, heartburn, hemoptysis, myalgias, postnasal drip, rash, sore throat, sweats or weight loss. The symptoms are aggravated by exercise. She has tried nothing for the symptoms.    Patient states she has been around couple people who have been out of state. Patient is worried she may have caught something from them. Patient has ear congestion, sinus congestion, shortness of breath, and wheezing. All symptoms worsened this afternoon.   Allergies  Allergen Reactions  . Darvon [Propoxyphene]   . Ketorolac Other (See Comments)    Vomiting, severe lower back pain/cramping and abdomen area.  . Liraglutide   . Toradol [Ketorolac Tromethamine] Other (See Comments)    Vomiting, severe lower back pain/cramping and abdomen area.  . Vioxx [Rofecoxib] Other (See Comments)  . Xopenex [Levalbuterol]   . Hydrocodone-Acetaminophen Itching and Rash    Per patient  . Hydrocodone-Acetaminophen Itching and Rash    Per patient     Current Outpatient Medications:  .  escitalopram (LEXAPRO) 20 MG tablet, TAKE ONE (1) TABLET EACH DAY, Disp: 90 tablet, Rfl: 4 .  glucose blood (ACCU-CHEK COMPACT STRIPS) test strip, Check sugar once daily-need QS 30 day supply. DX E11.9, Disp: 100 each, Rfl: 12 .  glucose blood (ONE TOUCH ULTRA TEST) test strip, Check sugar once daily. DX E11.9, Disp: 100 each, Rfl: 12 .  glucose blood (ONETOUCH VERIO) test strip, Use as instructed,  Disp: 100 each, Rfl: 12 .  JANUMET 50-1000 MG tablet, TAKE ONE TABLET TWICE A DAY WITH FOOD, Disp: 60 tablet, Rfl: 11 .  losartan (COZAAR) 50 MG tablet, Take 1 tablet (50 mg total) by mouth daily., Disp: 90 tablet, Rfl: 3 .  omeprazole (PRILOSEC) 40 MG capsule, TAKE ONE (1) CAPSULE EACH DAY, Disp: 30 capsule, Rfl: 11 .  Verapamil HCl CR 300 MG CP24, Take 1 capsule (300 mg total) by mouth daily., Disp: 90 each, Rfl: 3 .  benzonatate (TESSALON) 200 MG capsule, Take 1 capsule (200 mg total) by mouth at bedtime as needed for cough. (Patient not taking: Reported on 08/28/2018), Disp: 20 capsule, Rfl: 0 .  fluticasone (FLONASE) 50 MCG/ACT nasal spray, Place 2 sprays into both nostrils daily. (Patient not taking: Reported on 10/13/2018), Disp: 16 g, Rfl: 0 .  Verapamil HCl CR 300 MG CP24, Take 1 capsule (300 mg total) by mouth daily. (Patient not taking: Reported on 10/13/2018), Disp: 90 each, Rfl: 3 .  Verapamil HCl CR 300 MG CP24, Take 1 capsule (300 mg total) by mouth daily. (Patient not taking: Reported on 10/13/2018), Disp: 90 each, Rfl: 3 .  Verapamil HCl CR 300 MG CP24, TAKE ONE (1) CAPSULE EACH DAY (Patient not taking: Reported on 10/13/2018), Disp: 30 capsule, Rfl: 11  Review of Systems  Constitutional: Negative for chills, fever and weight loss.  HENT: Positive for congestion and rhinorrhea. Negative for ear pain, postnasal drip and sore throat.   Respiratory: Positive for cough, shortness of breath and wheezing. Negative  for hemoptysis and chest tightness.   Cardiovascular: Negative for chest pain.  Gastrointestinal: Negative for heartburn.  Musculoskeletal: Negative for myalgias.  Skin: Negative for rash.  Neurological: Negative for headaches.    Social History   Tobacco Use  . Smoking status: Never Smoker  . Smokeless tobacco: Never Used  Substance Use Topics  . Alcohol use: No    Alcohol/week: 0.0 standard drinks      Objective:   BP (!) 153/78 (BP Location: Left Arm, Patient  Position: Sitting, Cuff Size: Large)   Pulse 87   Temp 98.1 F (36.7 C) (Oral)   Resp 16   Wt 201 lb (91.2 kg)   SpO2 97%   BMI 33.45 kg/m  Vitals:   10/13/18 1800  BP: (!) 153/78  Pulse: 87  Resp: 16  Temp: 98.1 F (36.7 C)  TempSrc: Oral  SpO2: 97%  Weight: 201 lb (91.2 kg)     Physical Exam Vitals signs reviewed.  Constitutional:      General: She is not in acute distress.    Appearance: She is well-developed. She is not diaphoretic.  HENT:     Head: Normocephalic and atraumatic.     Right Ear: Hearing, tympanic membrane, ear canal and external ear normal.     Left Ear: Hearing, tympanic membrane, ear canal and external ear normal.     Nose: Nose normal.     Mouth/Throat:     Pharynx: Uvula midline. No oropharyngeal exudate.  Eyes:     General: No scleral icterus.       Right eye: No discharge.        Left eye: No discharge.     Conjunctiva/sclera: Conjunctivae normal.     Pupils: Pupils are equal, round, and reactive to light.  Neck:     Musculoskeletal: Normal range of motion and neck supple.     Thyroid: No thyromegaly.     Trachea: No tracheal deviation.  Cardiovascular:     Rate and Rhythm: Normal rate and regular rhythm.     Heart sounds: Normal heart sounds. No murmur. No friction rub. No gallop.   Pulmonary:     Effort: Pulmonary effort is normal. No respiratory distress.     Breath sounds: No stridor. Wheezing and rhonchi present. No rales.  Lymphadenopathy:     Cervical: No cervical adenopathy.  Skin:    General: Skin is warm and dry.         Assessment & Plan    1. Flu-like symptoms Flu testing negative.  - POCT Influenza A/B  2. Acute bronchitis with COPD (HCC) Worsening symptoms with high risk for complications such as pneumonia due to chronic bronchitis. Will treat with Zpak as below. Advised to use tylenol prn for body aches and fevers. Push fluids. Call if symptoms worsen or fail to improve.  - azithromycin (ZITHROMAX) 250 MG  tablet; Take 2 tablets PO on day one, and one tablet PO daily thereafter until completed.  Dispense: 6 tablet; Refill: 0  3. Cough Tessalon perles for cough.  - benzonatate (TESSALON) 200 MG capsule; Take 1 capsule (200 mg total) by mouth 2 (two) times daily as needed for cough.  Dispense: 20 capsule; Refill: 0     Margaretann Loveless, PA-C  [facility]

## 2018-10-13 NOTE — Patient Instructions (Signed)

## 2018-10-31 ENCOUNTER — Ambulatory Visit (INDEPENDENT_AMBULATORY_CARE_PROVIDER_SITE_OTHER): Payer: Managed Care, Other (non HMO) | Admitting: Family Medicine

## 2018-10-31 ENCOUNTER — Other Ambulatory Visit: Payer: Self-pay

## 2018-10-31 ENCOUNTER — Encounter: Payer: Self-pay | Admitting: Family Medicine

## 2018-10-31 VITALS — BP 141/78 | HR 87 | Temp 98.8°F | Ht 65.0 in | Wt 196.8 lb

## 2018-10-31 DIAGNOSIS — R6889 Other general symptoms and signs: Secondary | ICD-10-CM

## 2018-10-31 DIAGNOSIS — J209 Acute bronchitis, unspecified: Secondary | ICD-10-CM | POA: Diagnosis not present

## 2018-10-31 DIAGNOSIS — I10 Essential (primary) hypertension: Secondary | ICD-10-CM

## 2018-10-31 DIAGNOSIS — E119 Type 2 diabetes mellitus without complications: Secondary | ICD-10-CM

## 2018-10-31 DIAGNOSIS — Z6830 Body mass index (BMI) 30.0-30.9, adult: Secondary | ICD-10-CM

## 2018-10-31 DIAGNOSIS — J440 Chronic obstructive pulmonary disease with acute lower respiratory infection: Secondary | ICD-10-CM | POA: Diagnosis not present

## 2018-10-31 MED ORDER — METHYLPREDNISOLONE ACETATE 40 MG/ML IJ SUSP
40.0000 mg | Freq: Once | INTRAMUSCULAR | Status: AC
  Administered 2018-10-31: 40 mg via INTRAMUSCULAR

## 2018-10-31 MED ORDER — DOXYCYCLINE HYCLATE 100 MG PO TABS: 100.0000 mg | ORAL_TABLET | Freq: Two times a day (BID) | ORAL | 0 refills | Status: AC

## 2018-10-31 NOTE — Patient Instructions (Signed)
Increase fluids. May take Robitussin over the counter as needed.

## 2018-10-31 NOTE — Progress Notes (Signed)
[name] Female    DOB: [birth year]   63 y.o.   MRN: [id number] Visit Date: 10/31/2018  Today's Provider: [name], MD   Chief Complaint  Patient presents with  . Cough    productive with tan and yellow mucus, since jan 24th she started getting better a week ago and now the bad cough is back.  nausea, diarrhea, fever as high as 101, nasal drainage, ears, sore throat, wheezing   Subjective:     HPI  Pt reports she is having flu like symptoms and possible bronchitis.   Cough productive with tan and yellow mucus, since jan 24th she started getting better a week ago and now the bad cough is back.  Pt has been having nausea, diarrhea, fever as high as 101, nasal drainage, ears feels like fluid in them, sore throat, wheezing, dizzy feeling.  Allergies  Allergen Reactions  . Darvon [Propoxyphene]   . Ketorolac Other (See Comments)    Vomiting, severe lower back pain/cramping and abdomen area.  . Liraglutide   . Toradol [Ketorolac Tromethamine] Other (See Comments)    Vomiting, severe lower back pain/cramping and abdomen area.  . Vioxx [Rofecoxib] Other (See Comments)  . Xopenex [Levalbuterol]   . Hydrocodone-Acetaminophen Itching and Rash    Per patient  . Hydrocodone-Acetaminophen Itching and Rash    Per patient     Current Outpatient Medications:  .  escitalopram (LEXAPRO) 20 MG tablet, TAKE ONE (1) TABLET EACH DAY, Disp: 90 tablet, Rfl: 4 .  fluticasone (FLONASE) 50 MCG/ACT nasal spray, Place 2 sprays into both nostrils daily., Disp: 16 g, Rfl: 0 .  glucose blood (ACCU-CHEK COMPACT STRIPS) test strip, Check sugar once daily-need QS 30 day supply. DX E11.9, Disp: 100 each, Rfl: 12 .  glucose blood (ONE TOUCH ULTRA TEST) test strip, Check sugar once daily. DX E11.9, Disp: 100 each, Rfl: 12 .  glucose blood (ONETOUCH VERIO) test strip, Use as instructed, Disp: 100 each, Rfl: 12 .  JANUMET 50-1000 MG tablet, TAKE ONE TABLET TWICE A DAY WITH FOOD, Disp:  60 tablet, Rfl: 11 .  losartan (COZAAR) 50 MG tablet, Take 1 tablet (50 mg total) by mouth daily., Disp: 90 tablet, Rfl: 3 .  omeprazole (PRILOSEC) 40 MG capsule, TAKE ONE (1) CAPSULE EACH DAY, Disp: 30 capsule, Rfl: 11 .  Verapamil HCl CR 300 MG CP24, Take 1 capsule (300 mg total) by mouth daily., Disp: 90 each, Rfl: 3 .  azithromycin (ZITHROMAX) 250 MG tablet, Take 2 tablets PO on day one, and one tablet PO daily thereafter until completed. (Patient not taking: Reported on 10/31/2018), Disp: 6 tablet, Rfl: 0 .  benzonatate (TESSALON) 200 MG capsule, Take 1 capsule (200 mg total) by mouth 2 (two) times daily as needed for cough. (Patient not taking: Reported on 10/31/2018), Disp: 20 capsule, Rfl: 0  Review of Systems  Constitutional: Positive for chills and fever.  HENT: Positive for congestion, ear pain and sore throat.   Eyes: Negative.   Respiratory: Positive for wheezing. Cough: productive with tan and yellow mucus.   Cardiovascular: Negative.   Gastrointestinal: Negative.   Endocrine: Negative.   Neurological: Positive for dizziness and headaches.  Psychiatric/Behavioral: Negative.     Social History   Tobacco Use  . Smoking status: Never Smoker  . Smokeless tobacco: Never Used  Substance Use Topics  . Alcohol use: No    Alcohol/week: 0.0 standard drinks      Objective:  BP (!) 141/78 (BP Location: Right Arm, Patient Position: Sitting, Cuff Size: Normal)   Pulse 87   Temp 98.8 F (37.1 C) (Oral)   Ht 5\' 5"  (1.651 m)   Wt 196 lb 12.8 oz (89.3 kg)   SpO2 99%   BMI 32.75 kg/m  Vitals:   10/31/18 0935  BP: (!) 141/78  Pulse: 87  Temp: 98.8 F (37.1 C)  TempSrc: Oral  SpO2: 99%  Weight: 196 lb 12.8 oz (89.3 kg)  Height: 5\' 5"  (1.651 m)     Physical Exam Vitals signs reviewed.  Constitutional:      Appearance: Normal appearance. She is well-developed.  HENT:     Head: Normocephalic and atraumatic.     Right Ear: Tympanic membrane and external ear normal.      Left Ear: Tympanic membrane and external ear normal.     Nose: Nose normal.     Mouth/Throat:     Pharynx: Oropharynx is clear.  Eyes:     General: No scleral icterus.    Conjunctiva/sclera: Conjunctivae normal.  Neck:     Musculoskeletal: Normal range of motion.     Thyroid: No thyromegaly.  Cardiovascular:     Rate and Rhythm: Normal rate and regular rhythm.     Heart sounds: Normal heart sounds.  Pulmonary:     Effort: Pulmonary effort is normal.     Breath sounds: Normal breath sounds.  Abdominal:     Palpations: Abdomen is soft.  Musculoskeletal: Normal range of motion.  Lymphadenopathy:     Cervical: No cervical adenopathy.  Skin:    General: Skin is warm and dry.  Neurological:     General: No focal deficit present.     Mental Status: She is alert and oriented to person, place, and time.  Psychiatric:        Mood and Affect: Mood normal.        Behavior: Behavior normal.        Thought Content: Thought content normal.        Judgment: Judgment normal.         Assessment & Plan    1. Flu-like symptoms Patient has been sick for almost a month.  Too late to treat for the flu. Use fluids Robitussin and Tylenol and rest. 2. Acute bronchitis with COPD (HCC) Treat with antibiotics as she has been sick almost a month and is not improving presently.  Hopefully the prednisone will help her bronchial inflammation. - methylPREDNISolone acetate (DEPO-MEDROL) injection 40 mg - methylPREDNISolone acetate (DEPO-MEDROL) injection 40 mg - doxycycline (VIBRA-TABS) 100 MG tablet; Take 1 tablet (100 mg total) by mouth 2 (two) times daily for 5 days.  Dispense: 10 tablet; Refill: 0  3. Essential (primary) hypertension Avoid decongestants.  4. Type 2 diabetes mellitus without complication, without long-term current use of insulin (HCC) Patient informed blood sugars will probably rise due to the Depo-Medrol.  We will follow-up routinely for diabetes although that she is frequently  noncompliant with follow-up.    I have done the exam and reviewed the above chart and it is accurate to the best of my knowledge. Dentist has been used in this note in any air is in the dictation or transcription are unintentional.  [name], MD  [facility]

## 2019-01-08 ENCOUNTER — Ambulatory Visit: Payer: Self-pay | Admitting: Family Medicine

## 2019-01-15 ENCOUNTER — Ambulatory Visit: Payer: Self-pay | Admitting: Family Medicine

## 2019-01-15 ENCOUNTER — Telehealth: Payer: Self-pay

## 2019-01-15 DIAGNOSIS — R42 Dizziness and giddiness: Secondary | ICD-10-CM

## 2019-01-15 MED ORDER — MECLIZINE HCL 25 MG PO TABS: 25.0000 mg | ORAL_TABLET | Freq: Three times a day (TID) | ORAL | 1 refills | Status: DC | PRN

## 2019-01-15 NOTE — Telephone Encounter (Signed)
Patient was scheduled to come in today for a 4 mo f/u appointment, but she had to cancel due to having problems with inner ear. She says she is in bed and cant move around without getting dizzy and nauseous. She reports that she has had this problem with inner ear several times in the past and Dr. Sullivan Lone usually calls something in for her. Patient is requesting a prescription for something like Meclizine to help with the dizziness/ off balance. These symptoms started 2 days ago. EMS came out to her house yesterday because she was so sick.  Other associated symptoms includes nausea, vomiting. Patient denies any chest pain, numbness, blurred vision or headache. [facility]acy[facility]. Please call patient back.

## 2019-01-15 NOTE — Telephone Encounter (Signed)
Advised patient as below. Medication was sent into the pharmacy.  

## 2019-01-15 NOTE — Telephone Encounter (Signed)
Meclizine 25 mg TID prn #30,1rf

## 2019-02-07 LAB — HM DIABETES EYE EXAM

## 2019-02-10 ENCOUNTER — Other Ambulatory Visit: Payer: Self-pay | Admitting: Family Medicine

## 2019-02-22 ENCOUNTER — Other Ambulatory Visit: Payer: Self-pay | Admitting: Otolaryngology

## 2019-02-22 ENCOUNTER — Other Ambulatory Visit (HOSPITAL_COMMUNITY): Payer: Self-pay | Admitting: Otolaryngology

## 2019-02-22 DIAGNOSIS — R110 Nausea: Secondary | ICD-10-CM

## 2019-02-22 DIAGNOSIS — R42 Dizziness and giddiness: Secondary | ICD-10-CM

## 2019-03-04 ENCOUNTER — Other Ambulatory Visit: Payer: Self-pay

## 2019-03-04 ENCOUNTER — Ambulatory Visit
Admission: RE | Admit: 2019-03-04 | Discharge: 2019-03-04 | Disposition: A | Discharge status: Home / Self Care | Payer: Medicare HMO | Source: Ambulatory Visit | Attending: Otolaryngology | Admitting: Otolaryngology

## 2019-03-04 DIAGNOSIS — R110 Nausea: Secondary | ICD-10-CM | POA: Diagnosis present

## 2019-03-04 DIAGNOSIS — R42 Dizziness and giddiness: Secondary | ICD-10-CM | POA: Diagnosis present

## 2019-03-04 LAB — POCT I-STAT CREATININE: Creatinine, Ser: 0.6 mg/dL (ref 0.44–1.00)

## 2019-03-04 MED ORDER — GADOBUTROL 1 MMOL/ML IV SOLN
9.0000 mL | Freq: Once | INTRAVENOUS | Status: AC | PRN
  Administered 2019-03-04: 9 mL via INTRAVENOUS

## 2019-03-08 ENCOUNTER — Telehealth: Payer: Self-pay

## 2019-03-08 NOTE — Telephone Encounter (Signed)
Patient wants to know of you can print out a copy of her old insurance information for an outstanding bill.

## 2019-04-11 NOTE — Telephone Encounter (Signed)
done

## 2019-05-14 ENCOUNTER — Other Ambulatory Visit: Payer: Self-pay | Admitting: Family Medicine

## 2019-05-14 DIAGNOSIS — E119 Type 2 diabetes mellitus without complications: Secondary | ICD-10-CM

## 2019-07-11 ENCOUNTER — Other Ambulatory Visit: Payer: Self-pay | Admitting: Family Medicine

## 2019-07-11 DIAGNOSIS — E119 Type 2 diabetes mellitus without complications: Secondary | ICD-10-CM

## 2019-09-15 ENCOUNTER — Other Ambulatory Visit: Payer: Self-pay | Admitting: Family Medicine

## 2019-09-15 DIAGNOSIS — E119 Type 2 diabetes mellitus without complications: Secondary | ICD-10-CM

## 2019-09-18 ENCOUNTER — Other Ambulatory Visit: Payer: Self-pay | Admitting: Family Medicine

## 2019-09-18 NOTE — Telephone Encounter (Signed)
Requested medication (s) are due for refill today: yes  Requested medication (s) are on the active medication list: yes  Last refill:  05/10/2019  Future visit scheduled: no  Notes to clinic:  no valid encounter in last 6 months    Requested Prescriptions  Pending Prescriptions Disp Refills   escitalopram (LEXAPRO) 20 MG tablet [Pharmacy Med Name: ESCITALOPRAM 20 MG TABLET] 90 tablet 4    Sig: TAKE ONE (1) TABLET EACH DAY      Psychiatry:  Antidepressants - SSRI Failed - 09/18/2019 10:19 AM      Failed - Valid encounter within last 6 months    Recent Outpatient Visits           10 months ago Flu-like symptoms   [facility] Hudson., [place]   11 months ago Flu-like symptoms   [facility] [place], Alessandra [place], [place]   1 year ago Annual physical exam   [facility] Maple Hudson., [place]   1 year ago Type 2 diabetes mellitus without complication, without long-term current use of insulin [facility])   [facility] Maple Hudson., [place]   2 years ago Essential (primary) hypertension   [facility] Maple Hudson., [place]              Passed - Completed PHQ-2 or PHQ-9 in the last 360 days.

## 2019-10-09 ENCOUNTER — Other Ambulatory Visit: Payer: Self-pay | Admitting: Family Medicine

## 2019-10-09 DIAGNOSIS — E119 Type 2 diabetes mellitus without complications: Secondary | ICD-10-CM

## 2019-10-09 NOTE — Telephone Encounter (Signed)
Requested medication (s) are due for refill today: yes  Requested medication (s) are on the active medication list: yes  Last refill:  09/15/2019  Future visit scheduled: no  Notes to clinic:  due for labs and follow up   Requested Prescriptions  Pending Prescriptions Disp Refills   JANUMET 50-1000 MG tablet [Pharmacy Med Name: JANUMET 50-1,000 MG TABLET] 60 tablet 0    Sig: TAKE ONE TABLET TWICE A DAY WITH FOOD      Endocrinology:  Diabetes - Biguanide + DPP-4 Inhibitor Combos Failed - 10/09/2019  1:27 AM      Failed - HBA1C is between 0 and 7.9 and within 180 days    Hemoglobin A1C  Date Value Ref Range Status  08/28/2018 7.5 (A) 4.0 - 5.6 % Final   Hgb A1c MFr Bld  Date Value Ref Range Status  04/25/2018 8.8 (H) 4.8 - 5.6 % Final    Comment:             Prediabetes: 5.7 - 6.4          Diabetes: >6.4          Glycemic control for adults with diabetes: <7.0           Failed - eGFR in normal range and within 360 days    GFR calc Af Amer  Date Value Ref Range Status  04/25/2018 115 >59 mL/min/1.73 Final   GFR calc non Af Amer  Date Value Ref Range Status  04/25/2018 99 >59 mL/min/1.73 Final          Failed - Valid encounter within last 6 months    Recent Outpatient Visits           11 months ago Flu-like symptoms   [facility] [place] Banana., [place]   12 months ago Flu-like symptoms   [facility] [place], [place]rel, [place]   1 year ago Annual physical exam   [facility] [place] Banana., [place]   1 year ago Type 2 diabetes mellitus without complication, without long-term current use of insulin [facility])   [facility] Banana., [place]   2 years ago Essential (primary) hypertension   [facility] Jer[place] Banana., [place]              Passed - Cr in normal range and within 360 days    Creatinine, Ser  Date Value Ref Range Status  03/04/2019 0.60 0.44 - 1.00 mg/dL Final

## 2019-10-10 ENCOUNTER — Other Ambulatory Visit: Payer: Self-pay | Admitting: Family Medicine

## 2019-10-10 NOTE — Telephone Encounter (Signed)
Requested medication (s) are due for refill today: no  Requested medication (s) are on the active medication list: yes  Last refill:  09/18/2019  Future visit scheduled: yes REQUEST FOR 90 DAYS PRESCRIPTION Notes to clinic:     Requested Prescriptions  Pending Prescriptions Disp Refills   escitalopram (LEXAPRO) 20 MG tablet [Pharmacy Med Name: ESCITALOPRAM 20 MG TABLET] 90 tablet 1    Sig: Take 1 tablet (20 mg total) by mouth daily. Please schedule office visit before any future refills      Psychiatry:  Antidepressants - SSRI Failed - 10/10/2019 12:31 PM      Failed - Valid encounter within last 6 months    Recent Outpatient Visits           11 months ago Flu-like symptoms   [facility] Maple Hudson., [place]   12 months ago Flu-like symptoms   [facility], [place]essandra [place], [place]   1 year ago Annual physical exam   [facility] Maple Hudson., [place]   1 year ago Type 2 diabetes mellitus without complication, without long-term current use of insulin [facility])   [facility] Maple Hudson., [place]   2 years ago Essential (primary) hypertension   [facility] Maple Hudson., MD              Passed - Completed PHQ-2 or PHQ-9 in the last 360 days.

## 2019-10-11 ENCOUNTER — Other Ambulatory Visit: Payer: Self-pay | Admitting: Family Medicine

## 2019-10-11 MED ORDER — ESCITALOPRAM OXALATE 20 MG PO TABS: 20.0000 mg | ORAL_TABLET | Freq: Every day | ORAL | 0 refills | Status: DC

## 2019-10-11 NOTE — Telephone Encounter (Signed)
I refilled for 1 month but pt needs appt for any further refills.

## 2019-10-11 NOTE — Telephone Encounter (Signed)
Requested medication (s) are due for refill today: yes  Requested medication (s) are on the active medication list: yes  Last refill:  07/14/2019  Future visit scheduled: no  Notes to clinic: no valid encounter within last 6 months    Requested Prescriptions  Pending Prescriptions Disp Refills   valsartan (DIOVAN) 40 MG tablet [Pharmacy Med Name: VALSARTAN 40 MG TABLET] 90 tablet 4    Sig: TAKE 1 TABLET BY MOUTH EVERY DAY      Cardiovascular:  Angiotensin Receptor Blockers Failed - 10/11/2019  1:23 AM      Failed - Cr in normal range and within 180 days    Creatinine, Ser  Date Value Ref Range Status  03/04/2019 0.60 0.44 - 1.00 mg/dL Final          Failed - K in normal range and within 180 days    Potassium  Date Value Ref Range Status  04/25/2018 4.5 3.5 - 5.2 mmol/L Final          Failed - Last BP in normal range    BP Readings from Last 1 Encounters:  10/31/18 (!) 141/78          Failed - Valid encounter within last 6 months    Recent Outpatient Visits           11 months ago Flu-like symptoms   [facility] Maple Hudson., MD   12 months ago Flu-like symptoms   [facility] [place], Alessandra [place], [place]   1 year ago Annual physical exam   [facility] Maple Hudson., [place]   1 year ago Type 2 diabetes mellitus without complication, without long-term current use of insulin [facility])   [facility] Maple Hudson., MD   2 years ago Essential (primary) hypertension   [facility] Maple Hudson., [place]              Passed - Patient is not pregnant

## 2019-11-09 ENCOUNTER — Other Ambulatory Visit: Payer: Self-pay | Admitting: Family Medicine

## 2019-11-10 ENCOUNTER — Other Ambulatory Visit: Payer: Self-pay | Admitting: Family Medicine

## 2019-11-10 DIAGNOSIS — E119 Type 2 diabetes mellitus without complications: Secondary | ICD-10-CM

## 2019-11-13 ENCOUNTER — Ambulatory Visit: Payer: Self-pay | Admitting: Family Medicine

## 2019-11-15 ENCOUNTER — Ambulatory Visit: Payer: Medicare HMO | Admitting: Family Medicine

## 2019-11-15 ENCOUNTER — Other Ambulatory Visit: Payer: Self-pay

## 2019-11-15 ENCOUNTER — Other Ambulatory Visit: Payer: Self-pay | Admitting: Family Medicine

## 2019-11-15 ENCOUNTER — Encounter: Payer: Self-pay | Admitting: Family Medicine

## 2019-11-15 VITALS — BP 135/74 | HR 80 | Temp 95.0°F | Wt 217.8 lb

## 2019-11-15 DIAGNOSIS — E119 Type 2 diabetes mellitus without complications: Secondary | ICD-10-CM

## 2019-11-15 DIAGNOSIS — I10 Essential (primary) hypertension: Secondary | ICD-10-CM | POA: Diagnosis not present

## 2019-11-15 DIAGNOSIS — E7800 Pure hypercholesterolemia, unspecified: Secondary | ICD-10-CM | POA: Diagnosis not present

## 2019-11-15 DIAGNOSIS — Z1231 Encounter for screening mammogram for malignant neoplasm of breast: Secondary | ICD-10-CM

## 2019-11-15 DIAGNOSIS — F411 Generalized anxiety disorder: Secondary | ICD-10-CM

## 2019-11-15 DIAGNOSIS — Z6830 Body mass index (BMI) 30.0-30.9, adult: Secondary | ICD-10-CM | POA: Diagnosis not present

## 2019-11-15 LAB — POCT GLYCOSYLATED HEMOGLOBIN (HGB A1C)
Estimated Average Glucose: 235
Hemoglobin A1C: 9.8 % — AB (ref 4.0–5.6)

## 2019-11-15 MED ORDER — ESCITALOPRAM OXALATE 20 MG PO TABS: 20.0000 mg | ORAL_TABLET | Freq: Every day | ORAL | 0 refills | Status: DC

## 2019-11-15 MED ORDER — JANUMET 50-1000 MG PO TABS: ORAL_TABLET | ORAL | 3 refills | Status: DC

## 2019-11-15 MED ORDER — VALSARTAN 40 MG PO TABS: 40.0000 mg | ORAL_TABLET | Freq: Every day | ORAL | 6 refills | Status: DC

## 2019-11-15 NOTE — Progress Notes (Addendum)
[name] Female    DOB: 01/15/1956   64 y.o.   MRN: [id number] Visit Date: 11/15/2019  Today's Provider: [name], MD   Chief Complaint  Patient presents with  . Hypertension  . Diabetes Mellitus   Subjective:     HPI  Pleasant but noncompliant patient comes in today for follow-up of her diabetes because she needs refills.  She has not been seen in more than a year.  She has no complaints other than chronic foot pain with weightbearing for which she is seeing podiatry next week.  Dr. Alberteen Spindle. She does have a small knot in the back of her left side of her neck which has been there for a while.  It is not tender and is not growing. Physically she feels well.  Emotionally also.  She wonders about getting off the Lexapro. Essential (primary) hypertension From 08/28/2018-Controlled.  Type 2 diabetes mellitus without complication, without long-term current use of insulin (HCC) From 08/28/2018-Hemoglobin A1c was 7.5.    Allergies  Allergen Reactions  . Darvon [Propoxyphene]   . Ketorolac Other (See Comments)    Vomiting, severe lower back pain/cramping and abdomen area.  . Liraglutide   . Toradol [Ketorolac Tromethamine] Other (See Comments)    Vomiting, severe lower back pain/cramping and abdomen area.  . Vioxx [Rofecoxib] Other (See Comments)  . Xopenex [Levalbuterol]   . Hydrocodone-Acetaminophen Itching and Rash    Per patient  . Hydrocodone-Acetaminophen Itching and Rash    Per patient     Current Outpatient Medications:  .  azithromycin (ZITHROMAX) 250 MG tablet, Take 2 tablets PO on day one, and one tablet PO daily thereafter until completed. (Patient not taking: Reported on 10/31/2018), Disp: 6 tablet, Rfl: 0 .  benzonatate (TESSALON) 200 MG capsule, Take 1 capsule (200 mg total) by mouth 2 (two) times daily as needed for cough. (Patient not taking: Reported on 10/31/2018), Disp: 20 capsule, Rfl: 0 .  escitalopram (LEXAPRO) 20 MG tablet,  Take 1 tablet (20 mg total) by mouth daily. Please schedule office visit before any future refills, Disp: 30 tablet, Rfl: 0 .  fluticasone (FLONASE) 50 MCG/ACT nasal spray, Place 2 sprays into both nostrils daily., Disp: 16 g, Rfl: 0 .  glucose blood (ACCU-CHEK COMPACT STRIPS) test strip, Check sugar once daily-need QS 30 day supply. DX E11.9, Disp: 100 each, Rfl: 12 .  glucose blood (ONE TOUCH ULTRA TEST) test strip, Check sugar once daily. DX E11.9, Disp: 100 each, Rfl: 12 .  glucose blood (ONETOUCH VERIO) test strip, Use as instructed, Disp: 100 each, Rfl: 12 .  JANUMET 50-1000 MG tablet, TAKE ONE TABLET TWICE A DAY WITH FOOD, Disp: 60 tablet, Rfl: 0 .  losartan (COZAAR) 50 MG tablet, Take 1 tablet (50 mg total) by mouth daily., Disp: 90 tablet, Rfl: 3 .  meclizine (ANTIVERT) 25 MG tablet, Take 1 tablet (25 mg total) by mouth 3 (three) times daily as needed for dizziness., Disp: 30 tablet, Rfl: 1 .  omeprazole (PRILOSEC) 40 MG capsule, TAKE ONE (1) CAPSULE EACH DAY, Disp: 30 capsule, Rfl: 11 .  valsartan (DIOVAN) 40 MG tablet, TAKE 1 TABLET BY MOUTH EVERY DAY, Disp: 30 tablet, Rfl: 0 .  Verapamil HCl CR 300 MG CP24, TAKE ONE (1) CAPSULE EACH DAY, Disp: 30 capsule, Rfl: 11  Review of Systems  Constitutional: Negative for appetite change, chills, fatigue and fever.  HENT: Negative.   Eyes: Negative.   Respiratory: Negative for  chest tightness and shortness of breath.   Cardiovascular: Negative for chest pain and palpitations.  Gastrointestinal: Negative for abdominal pain, nausea and vomiting.  Endocrine: Negative.   Musculoskeletal: Positive for arthralgias.  Allergic/Immunologic: Negative.   Neurological: Negative for dizziness and weakness.  Psychiatric/Behavioral: Negative.     Social History   Tobacco Use  . Smoking status: Never Smoker  . Smokeless tobacco: Never Used  Substance Use Topics  . Alcohol use: No    Alcohol/week: 0.0 standard drinks      Objective:   There  were no vitals taken for this visit. There were no vitals filed for this visit.There is no height or weight on file to calculate BMI.   Physical Exam Vitals reviewed.  Constitutional:      Appearance: Normal appearance. She is well-developed.  HENT:     Head: Normocephalic and atraumatic.     Right Ear: Tympanic membrane and external ear normal.     Left Ear: Tympanic membrane and external ear normal.     Nose: Nose normal.     Mouth/Throat:     Pharynx: Oropharynx is clear.  Eyes:     General: No scleral icterus.    Conjunctiva/sclera: Conjunctivae normal.  Neck:     Thyroid: No thyromegaly.     Comments: Small posterior cervicla lymph node on left. Cardiovascular:     Rate and Rhythm: Normal rate and regular rhythm.     Heart sounds: Normal heart sounds.  Pulmonary:     Effort: Pulmonary effort is normal.     Breath sounds: Normal breath sounds.  Abdominal:     Palpations: Abdomen is soft.  Musculoskeletal:        General: Normal range of motion.     Cervical back: Normal range of motion.  Lymphadenopathy:     Cervical: Cervical adenopathy present.  Skin:    General: Skin is warm and dry.  Neurological:     General: No focal deficit present.     Mental Status: She is alert and oriented to person, place, and time.  Psychiatric:        Mood and Affect: Mood normal.        Behavior: Behavior normal.        Thought Content: Thought content normal.        Judgment: Judgment normal.      No results found for any visits on 11/15/19.     Assessment & Plan    1. Type 2 diabetes mellitus without complication, without long-term current use of insulin (HCC) A1c 9.8 today.  Poor control.  Patient has not been watching her diet nor has she been exercising.  She is on Janumet.  Consider Ozempic on the next visit if not improving. - POCT HgB A1C - CBC with Differential/Platelet - Comprehensive metabolic panel - Lipid panel - TSH  2. Essential (primary)  hypertension (Valsartan and verapamil - CBC with Differential/Platelet - Comprehensive metabolic panel - Lipid panel - TSH  3. BMI 30.0-30.9,adult  - CBC with Differential/Platelet - Comprehensive metabolic panel - Lipid panel - TSH  4. Pure hypercholesterolemia  - CBC with Differential/Platelet - Comprehensive metabolic panel - Lipid panel - TSH  5. Anxiety, generalized Cut Lexapro to every other day for 1 month and then stop. - CBC with Differential/Platelet - Comprehensive metabolic panel - Lipid panel - TSH 6.Lymphadenopathy Offered ENT referral for biopsy--I think it is benign     Wilhemena Durie, MD  [facility]

## 2019-11-15 NOTE — Patient Instructions (Signed)
Take Escitalopram (Lexapro) every other day for March and then stop!!!!

## 2019-11-20 ENCOUNTER — Ambulatory Visit: Payer: Self-pay

## 2019-11-20 NOTE — Telephone Encounter (Signed)
Phone call from pt.  Reported she has swelling of the right lower jaw line, from lower ear to the chin.  C/o soreness in the gland of right neck today.  Reported some swelling noticeable up along right cheekbone.  Stated she awakened with this today, and the swelling has increased from "mild to moderate".  Reported there is soreness in the jaw bone, and in the joint.  Stated there is "some warmth."   Denied redness or any rash.  Stated there is no swelling of lips, tongue or throat.  Denied fever/ chills.  Reported picking up wood debris from yard yesterday.  Denied any injury or any noticeable insect/ bug bite.  Stated the jaw has become "more stiff" throughout the day, and "harder to move".  Denied any cut or puncture wound from rusty nail.  Advised pt. To go to UC today for evaluation.  Pt. Verb. Understanding and agreed with plan.  Will send Triage note to PCP to make him aware.      Reason for Disposition . [1] Swelling is red AND [2] very painful to touch  Answer Assessment - Initial Assessment Questions 1. ONSET: "When did the swelling start?" (e.g., minutes, hours, days)     When she woke up this AM  2. LOCATION: "What part of the face is swollen?"     Right jaw right cheek and jawline 3. SEVERITY: "How swollen is it?"     moderate* 4. ITCHING: "Is there any itching?" If so, ask: "How much?"   (Scale 1-10; mild, moderate or severe)     denied 5. PAIN: "Is the swelling painful to touch?" If so, ask: "How painful is it?"   (Scale 1-10; mild, moderate or severe)     Pain 3/10 6. FEVER: "Do you have a fever?" If so, ask: "What is it, how was it measured, and when did it start?"     denied 7. CAUSE: "What do you think is causing the face swelling?"     unknown 8. RECURRENT SYMPTOM: "Have you had face swelling before?" If so, ask: "When was the last time?" "What happened that time?"     denied 9. OTHER SYMPTOMS: "Do you have any other symptoms?" (e.g., toothache, leg swelling)     Denied any  dental issues; denied swelling of lips, tongue, or throat.  Denied redness or rash.  Stated slightly warm to touch.  Right lymph gland slightly sore.  Denied any injury or contact with rusty nail.   10. PREGNANCY: "Is there any chance you are pregnant?" "When was your last menstrual period?"       N/a  Protocols used: [facility]

## 2019-11-21 ENCOUNTER — Ambulatory Visit
Admission: RE | Admit: 2019-11-21 | Discharge: 2019-11-21 | Disposition: A | Discharge status: Home / Self Care | Payer: Medicare HMO | Source: Ambulatory Visit | Attending: Family Medicine | Admitting: Family Medicine

## 2019-11-21 DIAGNOSIS — Z1231 Encounter for screening mammogram for malignant neoplasm of breast: Secondary | ICD-10-CM | POA: Insufficient documentation

## 2019-11-22 ENCOUNTER — Other Ambulatory Visit
Admission: RE | Admit: 2019-11-22 | Discharge: 2019-11-22 | Disposition: A | Discharge status: Home / Self Care | Payer: Medicare HMO | Source: Ambulatory Visit | Attending: Physician Assistant | Admitting: Physician Assistant

## 2019-11-22 ENCOUNTER — Encounter: Payer: Self-pay | Admitting: Physician Assistant

## 2019-11-22 ENCOUNTER — Other Ambulatory Visit: Payer: Self-pay

## 2019-11-22 ENCOUNTER — Ambulatory Visit (INDEPENDENT_AMBULATORY_CARE_PROVIDER_SITE_OTHER): Payer: Medicare HMO | Admitting: Physician Assistant

## 2019-11-22 VITALS — BP 150/75 | HR 79 | Temp 97.3°F | Wt 215.8 lb

## 2019-11-22 DIAGNOSIS — R6884 Jaw pain: Secondary | ICD-10-CM

## 2019-11-22 LAB — CBC WITH DIFFERENTIAL/PLATELET
Basophils Absolute: 0.1 10*3/uL (ref 0.0–0.2)
Basos: 1 %
EOS (ABSOLUTE): 0.2 10*3/uL (ref 0.0–0.4)
Eos: 3 %
Hematocrit: 34 % (ref 34.0–46.6)
Hemoglobin: 10.8 g/dL — ABNORMAL LOW (ref 11.1–15.9)
Immature Grans (Abs): 0 10*3/uL (ref 0.0–0.1)
Immature Granulocytes: 0 %
Lymphocytes Absolute: 2.7 10*3/uL (ref 0.7–3.1)
Lymphs: 32 %
MCH: 25.2 pg — ABNORMAL LOW (ref 26.6–33.0)
MCHC: 31.8 g/dL (ref 31.5–35.7)
MCV: 79 fL (ref 79–97)
Monocytes Absolute: 0.5 10*3/uL (ref 0.1–0.9)
Monocytes: 6 %
Neutrophils Absolute: 4.8 10*3/uL (ref 1.4–7.0)
Neutrophils: 58 %
Platelets: 242 10*3/uL (ref 150–450)
RBC: 4.28 x10E6/uL (ref 3.77–5.28)
RDW: 14 % (ref 11.7–15.4)
WBC: 8.3 10*3/uL (ref 3.4–10.8)

## 2019-11-22 LAB — COMPREHENSIVE METABOLIC PANEL
ALT: 23 IU/L (ref 0–32)
AST: 31 IU/L (ref 0–40)
Albumin/Globulin Ratio: 1.9 (ref 1.2–2.2)
Albumin: 4.3 g/dL (ref 3.8–4.8)
Alkaline Phosphatase: 88 IU/L (ref 39–117)
BUN/Creatinine Ratio: 14 (ref 12–28)
BUN: 10 mg/dL (ref 8–27)
Bilirubin Total: 0.2 mg/dL (ref 0.0–1.2)
CO2: 22 mmol/L (ref 20–29)
Calcium: 9.3 mg/dL (ref 8.7–10.3)
Chloride: 98 mmol/L (ref 96–106)
Creatinine, Ser: 0.7 mg/dL (ref 0.57–1.00)
GFR calc Af Amer: 105 mL/min/{1.73_m2} (ref >59)
GFR calc non Af Amer: 91 mL/min/{1.73_m2} (ref >59)
Globulin, Total: 2.3 g/dL (ref 1.5–4.5)
Glucose: 198 mg/dL — ABNORMAL HIGH (ref 65–99)
Potassium: 4.6 mmol/L (ref 3.5–5.2)
Sodium: 135 mmol/L (ref 134–144)
Total Protein: 6.6 g/dL (ref 6.0–8.5)

## 2019-11-22 LAB — LIPID PANEL
Chol/HDL Ratio: 3.3 ratio (ref 0.0–4.4)
Cholesterol, Total: 153 mg/dL (ref 100–199)
HDL: 47 mg/dL (ref >39)
LDL Chol Calc (NIH): 79 mg/dL (ref 0–99)
Triglycerides: 160 mg/dL — ABNORMAL HIGH (ref 0–149)
VLDL Cholesterol Cal: 27 mg/dL (ref 5–40)

## 2019-11-22 LAB — TSH: TSH: 1.94 u[IU]/mL (ref 0.450–4.500)

## 2019-11-22 LAB — SEDIMENTATION RATE: Sed Rate: 39 mm/hr — ABNORMAL HIGH (ref 0–30)

## 2019-11-22 LAB — C-REACTIVE PROTEIN: CRP: 1.2 mg/dL — ABNORMAL HIGH (ref <1.0)

## 2019-11-22 MED ORDER — PREDNISONE 20 MG PO TABS: 60.0000 mg | ORAL_TABLET | Freq: Every day | ORAL | 0 refills | Status: AC

## 2019-11-22 NOTE — Progress Notes (Signed)
[name] Female    DOB: 1955-12-02   64 y.o.   MRN: [id number] Visit Date: 11/22/2019  Today's Provider: Trinna Post, PA-C   Chief Complaint  Patient presents with  . Facial Swelling    cheek    Subjective:     HPI   Patient presents today for right side jaw pain and swollen. Patient says the swelling started Monday morning. Patient rpoerts the swelling is around her right temple and right jaw. She reports jaw claudication but when she is done chewing, the pain does not completely resolve. She is having difficulty opening her mouth. She has rheuamtoid arthritis. She has never been diagnosed with polymyalgia rheumatica. She does have diabetes. She is not having vision loss.   Allergies  Allergen Reactions  . Darvon [Propoxyphene]   . Ketorolac Other (See Comments)    Vomiting, severe lower back pain/cramping and abdomen area.  . Liraglutide   . Toradol [Ketorolac Tromethamine] Other (See Comments)    Vomiting, severe lower back pain/cramping and abdomen area.  . Vioxx [Rofecoxib] Other (See Comments)  . Xopenex [Levalbuterol]   . Hydrocodone-Acetaminophen Itching and Rash    Per patient  . Hydrocodone-Acetaminophen Itching and Rash    Per patient     Current Outpatient Medications:  .  azithromycin (ZITHROMAX) 250 MG tablet, Take 2 tablets PO on day one, and one tablet PO daily thereafter until completed. (Patient not taking: Reported on 10/31/2018), Disp: 6 tablet, Rfl: 0 .  benzonatate (TESSALON) 200 MG capsule, Take 1 capsule (200 mg total) by mouth 2 (two) times daily as needed for cough. (Patient not taking: Reported on 10/31/2018), Disp: 20 capsule, Rfl: 0 .  escitalopram (LEXAPRO) 20 MG tablet, Take 1 tablet (20 mg total) by mouth daily., Disp: 30 tablet, Rfl: 0 .  fluticasone (FLONASE) 50 MCG/ACT nasal spray, Place 2 sprays into both nostrils daily. (Patient not taking: Reported on 11/15/2019), Disp: 16 g, Rfl: 0 .  glucose blood (ACCU-CHEK  COMPACT STRIPS) test strip, Check sugar once daily-need QS 30 day supply. DX E11.9, Disp: 100 each, Rfl: 12 .  glucose blood (ONE TOUCH ULTRA TEST) test strip, Check sugar once daily. DX E11.9, Disp: 100 each, Rfl: 12 .  glucose blood (ONETOUCH VERIO) test strip, Use as instructed, Disp: 100 each, Rfl: 12 .  losartan (COZAAR) 50 MG tablet, Take 1 tablet (50 mg total) by mouth daily. (Patient not taking: Reported on 11/15/2019), Disp: 90 tablet, Rfl: 3 .  meclizine (ANTIVERT) 25 MG tablet, Take 1 tablet (25 mg total) by mouth 3 (three) times daily as needed for dizziness. (Patient not taking: Reported on 11/15/2019), Disp: 30 tablet, Rfl: 1 .  omeprazole (PRILOSEC) 40 MG capsule, TAKE ONE (1) CAPSULE EACH DAY, Disp: 30 capsule, Rfl: 11 .  sitaGLIPtin-metformin (JANUMET) 50-1000 MG tablet, TAKE ONE TABLET TWICE A DAY WITH FOOD, Disp: 60 tablet, Rfl: 3 .  valsartan (DIOVAN) 40 MG tablet, Take 1 tablet (40 mg total) by mouth daily., Disp: 30 tablet, Rfl: 6 .  Verapamil HCl CR 300 MG CP24, TAKE ONE (1) CAPSULE EACH DAY, Disp: 30 capsule, Rfl: 11  Review of Systems  Social History   Tobacco Use  . Smoking status: Never Smoker  . Smokeless tobacco: Never Used  Substance Use Topics  . Alcohol use: No    Alcohol/week: 0.0 standard drinks      Objective:   There were no vitals taken for this visit. There were no  vitals filed for this visit.There is no height or weight on file to calculate BMI.   Physical Exam Constitutional:      Appearance: Normal appearance.  Cardiovascular:     Rate and Rhythm: Normal rate and regular rhythm.     Heart sounds: Normal heart sounds.  Pulmonary:     Effort: Pulmonary effort is normal.     Breath sounds: Normal breath sounds.  Skin:    General: Skin is warm and dry.  Neurological:     Mental Status: She is alert and oriented to person, place, and time. Mental status is at baseline.  Psychiatric:        Behavior: Behavior normal.        Thought Content:  Thought content normal.      No results found for any visits on 11/22/19.     Assessment & Plan    1. Jaw pain  Concerned for temporal arteritis. Will have her get CRP and ESR and start on 60 mg prednisone daily. Have her scheduled with Dr. Dahlia Byes on Monday for biopsy and further recommendations on continuing steroids. Counseled on strict return precautions. Will forward this to Dr. Dahlia Byes for his knowledge prior to office visit.   - Sed Rate (ESR) - C-reactive protein - predniSONE (DELTASONE) 20 MG tablet; Take 3 tablets (60 mg total) by mouth daily with breakfast for 7 days.  Dispense: 21 tablet; Refill: 0   The entirety of the information documented in the History of Present Illness, Review of Systems and Physical Exam were personally obtained by me. Portions of this information were initially documented by [facility] and reviewed by me for thoroughness and accuracy.   F/u PRN     Trinna Post, PA-C  [facility]

## 2019-11-22 NOTE — Patient Instructions (Signed)
Temporal Arteritis  Temporal arteritis is a condition that causes arteries to become inflamed. It usually affects arteries in your head and face, but arteries in any part of the body can become inflamed. The condition is also called giant cell arteritis.  Temporal arteritis can cause serious problems such as blindness. Early treatment can help prevent these problems. What are the causes? The cause of this condition is not known. What increases the risk? The following factors may make you more likely to develop this condition:  Being older than 50.  Being a woman.  Being Caucasian.  Being of Danish, Swedish, Finnish, Norwegian, or Icelandic ancestry.  Having a family history of the condition.  Having a certain condition that causes muscle pain and stiffness (polymyalgia rheumatica, PMR). What are the signs or symptoms? Some people with temporal arteritis have just one symptom, while others have several symptoms. Most symptoms are related to the head and face. These may include:  Headache.  Hard or swollen temples. This is common. Your temples are the areas on either side of your forehead. If your temples are swollen, it may hurt to touch them.  Pain when combing your hair or when laying your head down.  Pain in the jaw when chewing.  Pain in the throat or tongue.  Problems with your vision, such as sudden loss of vision in one eye, or seeing double. Other symptoms may include:  Fever.  Tiredness (fatigue).  A dry cough.  Pain in the hips and shoulders.  Pain in the arms during exercise.  Depression.  Weight loss. How is this diagnosed? This condition may be diagnosed based on:  Your symptoms.  Your medical history.  A physical exam.  Tests, including: ? Blood tests. ? A test in which a tissue sample is removed from an artery so it can be examined (biopsy). ? Imaging tests, such as an ultrasound or MRI. How is this treated? This condition may be treated  with:  A type of medicine to reduce inflammation (corticosteroid).  Medicines to weaken your immune system (immunosuppressants).  Other medicines to treat vision problems. You will need to see your health care provider while you are being treated. The medicines used to treat this condition can increase your risk of problems such as bone loss and diabetes. During follow-up visits, your health care provider will check for problems by:  Doing blood tests and bone density tests.  Checking your blood pressure and blood sugar. Follow these instructions at home: Medicines  Take over-the-counter and prescription medicines only as told by your health care provider.  Take any vitamins or supplements recommended by your health care provider. These may include vitamin D and calcium, which help keep your bones from becoming weak. Eating and drinking   Eat a heart-healthy diet. This may include: ? Eating high-fiber foods, such as fresh fruits and vegetables, whole grains, and beans. ? Eating heart-healthy fats (omega-3 fats), such as fish, flaxseed, and flaxseed oil. ? Limiting foods that are high in saturated fat and cholesterol, such as processed and fried foods, fatty meat, and full-fat dairy. ? Limiting how much salt (sodium) you eat.  Include calcium and vitamin D in your diet. Good sources of calcium and vitamin D include: ? Low-fat dairy products such as milk, yogurt, and cheese. ? Certain fish, such as fresh or canned salmon, tuna, and sardines. ? Products that have calcium and vitamin D added to them (fortified products), such as fortified cereals or juice. General instructions  Exercise.   Talk with your health care provider about what exercises are okay for you. Exercises that increase your heart rate (aerobic exercise), such as walking, are often recommended. Aerobic exercise helps control your blood pressure and prevent bone loss.  Stay up to date on all vaccines as directed by your  health care provider.  Keep all follow-up visits as told by your health care provider. This is important. Contact a health care provider if:  Your symptoms get worse.  You develop signs of infection, such as fever, swelling, redness, warmth, and tenderness. Get help right away if:  You lose your vision.  Your pain does not go away, even after you take medicine.  You have chest pain.  You have trouble breathing.  One side of your face or body suddenly becomes weak or numb. These symptoms may represent a serious problem that is an emergency. Do not wait to see if the symptoms will go away. Get medical help right away. Call your local emergency services (911 in the [place] Do not drive yourself to the hospital. Summary  Temporal arteritis is a condition that causes arteries to become inflamed. It usually affects arteries in your head and face.  This condition can cause serious problems, such as blindness. Treatment can help prevent these problems.  Symptoms may include hard or tender temples, pain in your jaw when chewing, problems with your vision, or pain in your hips and shoulders.  Take over-the-counter and prescription medicines as told by your health care provider. This information is not intended to replace advice given to you by your health care provider. Make sure you discuss any questions you have with your health care provider. Document Revised: 10/13/2017 Document Reviewed: 10/11/2017 Elsevier Patient Education  2020 [employer]vier Inc.  

## 2019-11-26 ENCOUNTER — Encounter: Payer: Self-pay | Admitting: Surgery

## 2019-11-26 ENCOUNTER — Other Ambulatory Visit: Payer: Self-pay

## 2019-11-26 ENCOUNTER — Telehealth: Payer: Self-pay | Admitting: Family Medicine

## 2019-11-26 ENCOUNTER — Ambulatory Visit: Payer: Medicare HMO | Admitting: Surgery

## 2019-11-26 VITALS — BP 149/82 | HR 77 | Temp 98.1°F | Resp 14 | Ht 65.0 in | Wt 214.0 lb

## 2019-11-26 DIAGNOSIS — M316 Other giant cell arteritis: Secondary | ICD-10-CM | POA: Diagnosis not present

## 2019-11-26 NOTE — Telephone Encounter (Signed)
Patient advised.

## 2019-11-26 NOTE — Telephone Encounter (Signed)
Pt called stating that the prednisone she was prescribed is making her blood sugar levels too high. Pt is requesting to speak with nurse and to have another medications sent in for her. Please advise.     [id number] - [place], [place] - [address]  [address] [place] [address]  Phone: [phone] Fax: [phone]  Not a 24 hour pharmacy; exact hours not known.

## 2019-11-26 NOTE — Telephone Encounter (Signed)
There is not another good treatment option for this.  She can cut down on the dose by 1 tablet a day.

## 2019-11-26 NOTE — Patient Instructions (Addendum)
Do not shower for 48 hours. The derma bond will start peeling off within the next few days. Let it peel off on its own. Take Tylenol or Ibuprofen as needed for pain.   Please see your follow up appointment below.   Temporal Artery Biopsy  Arteries are blood vessels that carry blood from the heart to the rest of the body. Temporal arteries are found on the side of the head and between the ears and eyes. During a temporal artery biopsy, a sample of the temporal artery is removed and examined under a microscope. This procedure may be done to see if you have a condition that causes your arteries to become swollen or inflamed (temporal arteritis or giant cell arteritis). Tell a health care provider about:  Any allergies you have.  All medicines you are taking, including vitamins, herbs, eye drops, creams, and over-the-counter medicines.  Any problems you or family members have had with anesthetic medicines.  Any blood disorders you have.  Any surgeries you have had.  Any medical conditions you have or have had.  Whether you are pregnant or may be pregnant. What are the risks? Generally, this is a safe procedure. However, problems may occur, including:  Bleeding.  A collection of blood under the skin (hematoma).  Infection.  Nerve damage in the temples. This can cause numbness or make the muscles in your face weak.  Scarring. On the scalp, hair may not grow around the scar. What happens before the procedure?  You may have blood tests to make sure that your blood clots normally.  Ask your health care provider about: ? Changing or stopping your regular medicines. This is especially important if you are taking diabetes medicines or blood thinners. ? Taking medicines such as aspirin and ibuprofen. These medicines can thin your blood. Do not take these medicines unless your health care provider tells you to take them. ? Taking over-the-counter medicines, vitamins, herbs, and  supplements.  Follow instructions from your health care provider about eating or drinking restrictions.  Plan to have someone take you home from the hospital or clinic.  If you will be going home right after the procedure, plan to have someone with you for 24 hours.  Ask your health care provider: ? How your surgery site will be marked. ? What steps will be taken to help prevent infection. These may include:  Removing hair at the surgery site.  Washing skin with a germ-killing soap.  Taking antibiotic medicine. What happens during the procedure?  Small monitors will be put on your body. They will be used to check your heart, blood pressure, and oxygen level.  An IV will be inserted into one of your veins.  You will be given one or more of the following: ? A medicine to help you relax (sedative). ? A medicine to numb the area (local anesthetic).  A tool called a Doppler may be used to find the artery.  An incision will be made over the temporal artery.  Two clamps will be placed on the artery. Then, a small piece of the artery between the clamps will be cut and removed.  The remaining ends of the artery will be closed with stitches (sutures) to avoid bleeding. The clamps will then be removed.  The incision will be closed with sutures.  A bandage (dressing) may be placed on the incision.  The artery piece that was taken out will be checked under a microscope. The procedure may vary among health care  providers and hospitals. What happens after the procedure?  Your blood pressure, heart rate, breathing rate, and blood oxygen level may be monitored until you leave the hospital or clinic.  You may be given medicine for pain.  Do not drive for 24 hours if you were given a sedative during your procedure. Summary  During a temporal artery biopsy, a sample of the temporal artery is removed and examined under a microscope. This procedure may be done to see if you have a  condition that causes your arteries to become swollen or inflamed (temporal arteritis or giant cell arteritis).  Before the procedure, follow instructions from your health care provider about changing or stopping your medicines and about any eating or drinking restrictions.  Plan to have someone take you home after the procedure and have someone with you for 24 hours.  During the procedure, the incision will be closed with sutures, and a bandage (dressing) may be placed on the incision. This information is not intended to replace advice given to you by your health care provider. Make sure you discuss any questions you have with your health care provider. Document Revised: 05/24/2018 Document Reviewed: 05/24/2018 Elsevier Patient Education  2020 [employer].

## 2019-11-27 ENCOUNTER — Telehealth: Payer: Self-pay

## 2019-11-27 NOTE — Telephone Encounter (Signed)
We will

## 2019-11-27 NOTE — Telephone Encounter (Signed)
Copied from CRM [id number]. Topic: General - Other >> Nov 27, 2019  8:24 AM Sarah Carson wrote: Reason for CRM: Pt called and is requesting to speak with nurse regarding tapering off a medication. Please advise.

## 2019-11-27 NOTE — Telephone Encounter (Signed)
Patient scheduled for follow up appointment.

## 2019-11-27 NOTE — Telephone Encounter (Signed)
Patient would like to know who will be treating her since she had her biopsy done yesterday, would it be pcp or Dr. Everlene Farrier? Please advise?

## 2019-11-27 NOTE — Progress Notes (Signed)
Patient ID: Sarah Carson, female   DOB: 1956-06-25, 64 y.o.   MRN: [id number]  HPI Sarah Carson is a 64 y.o. female seen in consultation at the request of Mrs. Pollak PA-C for biopsy due to potential giant cell arteritis.  Of note the patient reports having temporal and jaw pain for the last 10 days or so.  Pain is moderate intensity sharp in nature and intermittent.  Worsening when she opens her mouth.  There is no fevers no chills she does have a history of hypertension and diabetes, rheumatoid arthritis.. She denies any vision disturbances. She has already been started on prednisone. Her C-reactive protein was 1.2 as well as a sedimentation rate of 39 that is mildly elevated. CBC showed a hemoglobin of 10.8 and a CMP was completely normal except hyperglycemia.  Her hemoglobin 1 AC is 9.8  HPI  Past Medical History:  Diagnosis Date  . Diabetes (HCC)   . Hypertension     Past Surgical History:  Procedure Laterality Date  . ABDOMINAL HYSTERECTOMY    . BREAST BIOPSY Right 2013   benign, with marker  . BREAST EXCISIONAL BIOPSY Bilateral yrs ago   benign  . BREAST SURGERY    . CHOLECYSTECTOMY    . KNEE ARTHROSCOPY    . RIGHT OOPHORECTOMY    . TONSILLECTOMY      Family History  Problem Relation Age of Onset  . Hypertension Mother   . Heart disease Mother   . Diabetes Mother   . Arthritis Mother   . Alcohol abuse Father   . Heart disease Father        died of MI age 21  . Neuromuscular disorder Sister   . Neuromuscular disorder Sister   . Diabetes Maternal Grandmother   . Arthritis Maternal Grandfather        rheumatoid  . Cancer Paternal Grandfather     Social History Social History   Tobacco Use  . Smoking status: Never Smoker  . Smokeless tobacco: Never Used  Substance Use Topics  . Alcohol use: No    Alcohol/week: 0.0 standard drinks  . Drug use: No    Allergies  Allergen Reactions  . Darvon [Propoxyphene]   . Ketorolac Other (See Comments)   Vomiting, severe lower back pain/cramping and abdomen area.  . Liraglutide   . Toradol [Ketorolac Tromethamine] Other (See Comments)    Vomiting, severe lower back pain/cramping and abdomen area.  . Vioxx [Rofecoxib] Other (See Comments)  . Xopenex [Levalbuterol]   . Hydrocodone-Acetaminophen Itching and Rash    Per patient  . Hydrocodone-Acetaminophen Itching and Rash    Per patient    Current Outpatient Medications  Medication Sig Dispense Refill  . escitalopram (LEXAPRO) 20 MG tablet Take 1 tablet (20 mg total) by mouth daily. 30 tablet 0  . fluticasone (FLONASE) 50 MCG/ACT nasal spray Place 2 sprays into both nostrils daily. 16 g 0  . glucose blood (ACCU-CHEK COMPACT STRIPS) test strip Check sugar once daily-need QS 30 day supply. DX E11.9 100 each 12  . glucose blood (ONE TOUCH ULTRA TEST) test strip Check sugar once daily. DX E11.9 100 each 12  . glucose blood (ONETOUCH VERIO) test strip Use as instructed 100 each 12  . meclizine (ANTIVERT) 25 MG tablet Take 1 tablet (25 mg total) by mouth 3 (three) times daily as needed for dizziness. 30 tablet 1  . omeprazole (PRILOSEC) 40 MG capsule TAKE ONE (1) CAPSULE EACH DAY 30 capsule 11  . predniSONE (  DELTASONE) 20 MG tablet Take 3 tablets (60 mg total) by mouth daily with breakfast for 7 days. 21 tablet 0  . sitaGLIPtin-metformin (JANUMET) 50-1000 MG tablet TAKE ONE TABLET TWICE A DAY WITH FOOD 60 tablet 3  . valsartan (DIOVAN) 40 MG tablet Take 1 tablet (40 mg total) by mouth daily. 30 tablet 6  . Verapamil HCl CR 300 MG CP24 TAKE ONE (1) CAPSULE EACH DAY 30 capsule 11   No current facility-administered medications for this visit.     Review of Systems Full ROS  was asked and was negative except for the information on the HPI  Physical Exam Blood pressure (!) 149/82, pulse 77, temperature 98.1 F (36.7 C), resp. rate 14, height 5\' 5"  (1.651 m), weight 214 lb (97.1 kg), SpO2 95 %. CONSTITUTIONAL: NAD. EYES: Pupils are equal,  round, and reactive to light, Sclera are non-icteric. There is tenderness to palpation on the right temporal area from the tragus all the distal to the zygomatic bone. EARS, NOSE, MOUTH AND THROAT: The oropharynx is clear. The oral mucosa is pink and moist. Hearing is intact to voice. LYMPH NODES:  Lymph nodes in the neck are normal. RESPIRATORY:  Lungs are clear. There is normal respiratory effort, with equal breath sounds bilaterally, and without pathologic use of accessory muscles. CARDIOVASCULAR: Heart is regular without murmurs, gallops, or rubs. GI: The abdomen is soft, nontender, and nondistended. There are no palpable masses. There is no hepatosplenomegaly. There are normal bowel sounds in all quadrants. GU: Rectal deferred.   MUSCULOSKELETAL: Normal muscle strength and tone. No cyanosis or edema.   SKIN: Turgor is good and there are no pathologic skin lesions or ulcers. NEUROLOGIC: Motor and sensation is grossly normal. Cranial nerves are grossly intact. PSYCH:  Oriented to person, place and time. Affect is normal.  Data Reviewed  I have personally reviewed the patient's imaging, laboratory findings and medical records.    Assessment/Plan 64 year old female with right temporal pain suspicious for giant cell arteritis.  Discussed with the patient in detail about her diagnosis and the need for temporal artery biopsy.  Procedure discussed with the patient in detail.  Risk, benefits and possible implications including but not limited to: Bleeding, infection, reinterventions and failure to diagnose disease .  She understands and she wishes to proceed   PROCEDURE NOTE   DX: Giant cell arteritis  Procedure: Right temporal artery ligation and excisional biopsy  EBL: : 3cc  Anesthesia: lidocaine 1% w epi  Complications: None  She was placed in the supine position and the area was prepped and draped in the usual sterile fashion.  Local anesthetic was infiltrated and a 4 cm incision  was created over the right temporal area.  Self-retaining retractor was placed and the artery was dissected for approximately 4 cm in length.  Proximal and distal ligation was achieved with a 2-0 Vicryl suture in the standard fashion and then the artery was divided using Metzenbaum scissors.  Specimen was sent for permanent pathology.  The wound was closed in 2 layers with 3-0 Vicryl and 4-0 Monocryl.  Dermabond was used to coat the skin     Caroleen Hamman, MD FACS General Surgeon 11/27/2019, 12:39 PM

## 2019-11-30 ENCOUNTER — Telehealth: Payer: Self-pay

## 2019-11-30 NOTE — Telephone Encounter (Signed)
Patient notified of results. Has a follow up appointment scheduled with Dr Everlene Farrier on 12/10/19.

## 2019-11-30 NOTE — Telephone Encounter (Signed)
-----   Message from Leafy Ro, MD sent at 11/30/2019 12:55 PM EDT ----- Regarding: RE: Results Please let her know that did not show giant cell arteritis, no temporal arteritis. thx ----- Message ----- From: Sinda Du, LPN Sent: 10/01/8675  12:51 PM EDT To: Leafy Ro, MD Subject: Results                                        Patient has been calling about her results. They just came in and she would like a call from you to let her know what they mean.

## 2019-12-04 NOTE — Progress Notes (Signed)
[name] Female    DOB: 10/18/1955   64 y.o.   MRN: [id number] Visit Date: 12/05/2019  Today's Provider: [name], MD   Chief Complaint  Patient presents with  . Jaw Pain    Follow up   Subjective:     HPI   Patient is recovering nicely from her biopsy of right temporal artery.  Biopsy was negative.  She has finished her prednisone and her headache is much better.  He still has a little tenderness in the right TMJ region and little pain in the right ear area.  No further jaw claudication.  She thinks it is swollen in the right TMJ area.  She also complains of tingling in her right face.  No rash and no pain.  The tingling does not go into the forehead.  Follow up for right sided jaw pain    The patient was last seen for this 2 weeks ago. Changes made at last visit include .  Prednisone and referral for temporal artery biopsy.  She is glad to be off the prednisone.  She has no complaints about her blood sugars.  She reports excellent compliance with treatment. She feels that condition is Improved.  But not completely resolved.  She is not having side effects.   ------------------------------------------------------------------------------------       Allergies  Allergen Reactions  . Darvon [Propoxyphene]   . Ketorolac Other (See Comments)    Vomiting, severe lower back pain/cramping and abdomen area.  . Liraglutide   . Toradol [Ketorolac Tromethamine] Other (See Comments)    Vomiting, severe lower back pain/cramping and abdomen area.  . Vioxx [Rofecoxib] Other (See Comments)  . Xopenex [Levalbuterol]   . Hydrocodone-Acetaminophen Itching and Rash    Per patient  . Hydrocodone-Acetaminophen Itching and Rash    Per patient     Current Outpatient Medications:  .  escitalopram (LEXAPRO) 20 MG tablet, Take 1 tablet (20 mg total) by mouth daily., Disp: 30 tablet, Rfl: 0 .  fluticasone (FLONASE) 50 MCG/ACT nasal spray, Place 2 sprays into  both nostrils daily., Disp: 16 g, Rfl: 0 .  glucose blood (ACCU-CHEK COMPACT STRIPS) test strip, Check sugar once daily-need QS 30 day supply. DX E11.9, Disp: 100 each, Rfl: 12 .  meclizine (ANTIVERT) 25 MG tablet, Take 1 tablet (25 mg total) by mouth 3 (three) times daily as needed for dizziness., Disp: 30 tablet, Rfl: 1 .  omeprazole (PRILOSEC) 40 MG capsule, TAKE ONE (1) CAPSULE EACH DAY, Disp: 30 capsule, Rfl: 11 .  sitaGLIPtin-metformin (JANUMET) 50-1000 MG tablet, TAKE ONE TABLET TWICE A DAY WITH FOOD, Disp: 60 tablet, Rfl: 3 .  valsartan (DIOVAN) 40 MG tablet, Take 1 tablet (40 mg total) by mouth daily., Disp: 30 tablet, Rfl: 6 .  Verapamil HCl CR 300 MG CP24, TAKE ONE (1) CAPSULE EACH DAY, Disp: 30 capsule, Rfl: 11 .  glucose blood (ONE TOUCH ULTRA TEST) test strip, Check sugar once daily. DX E11.9, Disp: 100 each, Rfl: 12 .  glucose blood (ONETOUCH VERIO) test strip, Use as instructed, Disp: 100 each, Rfl: 12  Review of Systems  Constitutional: Negative.  Negative for appetite change, chills, fatigue and fever.  HENT:       See HPI.  Respiratory: Negative for chest tightness and shortness of breath.   Cardiovascular: Negative.  Negative for chest pain and palpitations.  Gastrointestinal: Negative.  Negative for abdominal pain, nausea and vomiting.  Endocrine: Negative.   Musculoskeletal:  Positive for arthralgias (right sided jaw pain).  Allergic/Immunologic: Negative.   Neurological: Negative for dizziness, speech difficulty and weakness.       See HPI.  Psychiatric/Behavioral: Negative.     Social History   Tobacco Use  . Smoking status: Never Smoker  . Smokeless tobacco: Never Used  Substance Use Topics  . Alcohol use: No    Alcohol/week: 0.0 standard drinks      Objective:   BP 134/62 (BP Location: Left Arm, Patient Position: Sitting, Cuff Size: Large)   Pulse 72   Temp (!) 96.5 F (35.8 C) (Temporal)   Wt 217 lb (98.4 kg)   SpO2 98%   BMI 36.11 kg/m  Vitals:     12/05/19 1529  BP: 134/62  Pulse: 72  Temp: (!) 96.5 F (35.8 C)  TempSrc: Temporal  SpO2: 98%  Weight: 217 lb (98.4 kg)  Body mass index is 36.11 kg/m.   Physical Exam Vitals reviewed.  Constitutional:      Appearance: Normal appearance. She is well-developed.  HENT:     Head: Normocephalic and atraumatic.     Comments: She has minimal tenderness of the right TMJ area with no swelling or crepitance.    Right Ear: Tympanic membrane and external ear normal.     Left Ear: Tympanic membrane and external ear normal.     Nose: Nose normal.     Mouth/Throat:     Pharynx: Oropharynx is clear.  Eyes:     General: No scleral icterus.    Conjunctiva/sclera: Conjunctivae normal.  Neck:     Thyroid: No thyromegaly.  Cardiovascular:     Rate and Rhythm: Normal rate and regular rhythm.     Heart sounds: Normal heart sounds.  Pulmonary:     Effort: Pulmonary effort is normal.     Breath sounds: Normal breath sounds.  Abdominal:     Palpations: Abdomen is soft.  Musculoskeletal:     Cervical back: Normal range of motion.  Skin:    General: Skin is warm and dry.  Neurological:     General: No focal deficit present.     Mental Status: She is alert and oriented to person, place, and time.     Cranial Nerves: No cranial nerve deficit.     Comments: Patient states she has slightly decreased sensation in the right face and chin.  Forehead is normal.  Psychiatric:        Mood and Affect: Mood normal.        Behavior: Behavior normal.        Thought Content: Thought content normal.        Judgment: Judgment normal.      No results found for any visits on 12/05/19.     Assessment & Plan    1. Arthropathy of right temporomandibular joint This could be your parotid gland but I find no mass-effect at all.  Refer to ENT you for further work-up that they might feel is necessary. May need referral to neurology if the right facial tingling persist or she has further symptoms.  No  imaging needed at this time. 2. Type 2 diabetes mellitus without complication, without long-term current use of insulin ([place]) Long history of poor compliance with follow-up. Last A1c was up from 7.5to 9.8. 3. Pure hypercholesterolemia Consider statin in the future, last LDL was 79  4. BMI 30.0-30.9,adult Diet and exercise has been stressed.  5. Anxiety, generalized Chronic but relatively stable condition.     Mizael Sagar Rosanna Randy  Brooke Bonito, [facility]

## 2019-12-05 ENCOUNTER — Other Ambulatory Visit: Payer: Self-pay

## 2019-12-05 ENCOUNTER — Encounter: Payer: Self-pay | Admitting: Family Medicine

## 2019-12-05 ENCOUNTER — Ambulatory Visit: Payer: Medicare HMO | Admitting: Family Medicine

## 2019-12-05 VITALS — BP 134/62 | HR 72 | Temp 96.5°F | Wt 217.0 lb

## 2019-12-05 DIAGNOSIS — M26651 Arthropathy of right temporomandibular joint: Secondary | ICD-10-CM | POA: Diagnosis not present

## 2019-12-05 DIAGNOSIS — E119 Type 2 diabetes mellitus without complications: Secondary | ICD-10-CM | POA: Diagnosis not present

## 2019-12-05 DIAGNOSIS — F411 Generalized anxiety disorder: Secondary | ICD-10-CM

## 2019-12-05 DIAGNOSIS — Z6830 Body mass index (BMI) 30.0-30.9, adult: Secondary | ICD-10-CM

## 2019-12-05 DIAGNOSIS — E7800 Pure hypercholesterolemia, unspecified: Secondary | ICD-10-CM | POA: Diagnosis not present

## 2019-12-10 ENCOUNTER — Other Ambulatory Visit: Payer: Self-pay

## 2019-12-10 ENCOUNTER — Telehealth (INDEPENDENT_AMBULATORY_CARE_PROVIDER_SITE_OTHER): Payer: Self-pay | Admitting: Surgery

## 2019-12-10 DIAGNOSIS — Z09 Encounter for follow-up examination after completed treatment for conditions other than malignant neoplasm: Secondary | ICD-10-CM

## 2019-12-13 NOTE — Progress Notes (Signed)
Virtual visit done via phone call to patient cell phone.  She is doing very well and her jaw claudication has improved.  Biopsy results discussed with the patient in detail.  No evidence of temporal iron cell arteritis  No complaints at this time. Fevers no chills we will follow up on a as needed basis

## 2020-01-16 ENCOUNTER — Other Ambulatory Visit: Payer: Self-pay

## 2020-01-16 ENCOUNTER — Ambulatory Visit (INDEPENDENT_AMBULATORY_CARE_PROVIDER_SITE_OTHER): Payer: Medicare HMO | Admitting: Family Medicine

## 2020-01-16 ENCOUNTER — Encounter: Payer: Self-pay | Admitting: Family Medicine

## 2020-01-16 VITALS — BP 146/78 | HR 73 | Temp 97.5°F | Wt 213.8 lb

## 2020-01-16 DIAGNOSIS — F411 Generalized anxiety disorder: Secondary | ICD-10-CM | POA: Diagnosis not present

## 2020-01-16 DIAGNOSIS — Z1211 Encounter for screening for malignant neoplasm of colon: Secondary | ICD-10-CM

## 2020-01-16 DIAGNOSIS — Z0000 Encounter for general adult medical examination without abnormal findings: Secondary | ICD-10-CM | POA: Diagnosis not present

## 2020-01-16 NOTE — Progress Notes (Signed)
Annual Wellness Visit    I,Atiya M Cummings,acting as a scribe for [name], MD.,have documented all relevant documentation on the behalf of [name], MD,as directed by  [name], MD while in the presence of [name], MD.  [name], Female    DOB: 1956-07-28, 64 y.o.   MRN: [id number] Visit Date: 01/16/2020  Today's Provider: [name], MD   Chief Complaint  Patient presents with  . Welcome to Medicare   Subjective    AMAIRA SAFLEY is a 64 y.o. female who presents today for her Annual Wellness Visit. She reports consuming a general diet. Home exercise routine includes yard work. She generally feels fairly well. She reports sleeping fairly well. She does not have additional problems to discuss today.   HPI She is a widowed white female who [place] alone [place] town.  She is also retired since 2015.  She needs a refill on her Lexapro.      Medications: Outpatient Medications Prior to Visit  Medication Sig  . escitalopram (LEXAPRO) 20 MG tablet Take 1 tablet (20 mg total) by mouth daily.  Marland Kitchen glucose blood (ACCU-CHEK COMPACT STRIPS) test strip Check sugar once daily-need QS 30 day supply. DX E11.9  . meclizine (ANTIVERT) 25 MG tablet Take 1 tablet (25 mg total) by mouth 3 (three) times daily as needed for dizziness.  Marland Kitchen omeprazole (PRILOSEC) 40 MG capsule TAKE ONE (1) CAPSULE EACH DAY  . sitaGLIPtin-metformin (JANUMET) 50-1000 MG tablet TAKE ONE TABLET TWICE A DAY WITH FOOD  . valsartan (DIOVAN) 40 MG tablet Take 1 tablet (40 mg total) by mouth daily.  . Verapamil HCl CR 300 MG CP24 TAKE ONE (1) CAPSULE EACH DAY  . fluticasone (FLONASE) 50 MCG/ACT nasal spray Place 2 sprays into both nostrils daily. (Patient not taking: Reported on 01/16/2020)  . glucose blood (ONE TOUCH ULTRA TEST) test strip Check sugar once daily. DX E11.9 (Patient not taking: Reported on 01/16/2020)  . glucose blood (ONETOUCH VERIO)  test strip Use as instructed (Patient not taking: Reported on 01/16/2020)   No facility-administered medications prior to visit.    Allergies  Allergen Reactions  . Darvon [Propoxyphene]   . Ketorolac Other (See Comments)    Vomiting, severe lower back pain/cramping and abdomen area.  . Liraglutide   . Toradol [Ketorolac Tromethamine] Other (See Comments)    Vomiting, severe lower back pain/cramping and abdomen area.  . Vioxx [Rofecoxib] Other (See Comments)  . Xopenex [Levalbuterol]   . Hydrocodone-Acetaminophen Itching and Rash    Per patient  . Hydrocodone-Acetaminophen Itching and Rash    Per patient    Patient Care Team: Maple Hudson., MD as PCP - General (Family Medicine)  Review of Systems  Constitutional: Negative.   HENT: Positive for hearing loss, tinnitus and trouble swallowing.   Eyes: Positive for discharge and itching.  Respiratory: Negative.   Cardiovascular: Positive for chest pain and palpitations.  Gastrointestinal: Negative.   Endocrine: Negative.   Genitourinary: Negative.   Musculoskeletal: Positive for arthralgias, back pain and neck pain.  Skin: Negative.   Allergic/Immunologic: Positive for environmental allergies.  Neurological: Positive for dizziness.  Hematological: Negative.   Psychiatric/Behavioral: Negative.     Last hemoglobin A1c Lab Results  Component Value Date   HGBA1C 9.8 (A) 11/15/2019      Objective    Vitals: BP (!) 146/78 (BP Location: Right Arm, Patient Position: Sitting, Cuff Size: Large)  Pulse 73   Temp (!) 97.5 F (36.4 C) (Temporal)   Wt 213 lb 12.8 oz (97 kg)   BMI 35.58 kg/m  BP Readings from Last 3 Encounters:  01/16/20 (!) 146/78  12/05/19 134/62  11/26/19 (!) 149/82   Wt Readings from Last 3 Encounters:  01/16/20 213 lb 12.8 oz (97 kg)  12/05/19 217 lb (98.4 kg)  11/26/19 214 lb (97.1 kg)      Physical Exam Vitals reviewed. Exam conducted with a chaperone present (Normal breast exam).    Constitutional:      Appearance: Normal appearance. She is well-developed. She is obese.  HENT:     Head: Normocephalic and atraumatic.     Right Ear: External ear normal.     Left Ear: External ear normal.     Mouth/Throat:     Pharynx: Oropharynx is clear.  Eyes:     General: No scleral icterus. Neck:     Thyroid: No thyromegaly.  Cardiovascular:     Rate and Rhythm: Normal rate and regular rhythm.     Heart sounds: Normal heart sounds.  Pulmonary:     Effort: Pulmonary effort is normal.     Breath sounds: Normal breath sounds.  Chest:     Breasts:        Right: Normal.        Left: Normal.  Abdominal:     Palpations: Abdomen is soft.  Genitourinary:    General: Normal vulva.  Musculoskeletal:        General: Normal range of motion.     Cervical back: Normal range of motion.  Skin:    General: Skin is warm and dry.  Neurological:     General: No focal deficit present.     Mental Status: She is alert and oriented to person, place, and time. Mental status is at baseline.  Psychiatric:        Mood and Affect: Mood normal.        Behavior: Behavior normal.        Thought Content: Thought content normal.        Judgment: Judgment normal.      Most recent functional status assessment: In your present state of health, do you have any difficulty performing the following activities: 01/16/2020  Hearing? Y  Vision? Y  Difficulty concentrating or making decisions? N  Walking or climbing stairs? Y  Dressing or bathing? N  Doing errands, shopping? N  Some recent data might be hidden    Most recent fall risk assessment: Fall Risk  01/16/2020  Falls in the past year? 0  Number falls in past yr: 0  Injury with Fall? 0  Follow up Falls evaluation completed     Most recent depression screenings: PHQ 2/9 Scores 01/16/2020 10/31/2018  PHQ - 2 Score 0 0  PHQ- 9 Score 0 -    Most recent cognitive screening: No flowsheet data found.  No results found for any visits on  01/16/20.  Assessment & Plan     Annual wellness visit done today including the all of the following: Reviewed patient's Family Medical History Reviewed and updated list of patient's medical providers Assessment of cognitive impairment was done Assessed patient's functional ability Established a written schedule for health screening [place] Completed and Reviewed  Exercise Activities and Dietary recommendations Goals   None     Immunization History  Administered Date(s) Administered  . PPD Test 04/25/2018  . Tdap 03/21/2007  . Zoster 07/22/2011  Health Maintenance  Topic Date Due  . FOOT EXAM  Never done  . HIV Screening  Never done  . COVID-19 Vaccine (1) Never done  . PAP SMEAR-Modifier  Never done  . TETANUS/TDAP  03/20/2017  . DEXA SCAN  Never done  . PNA vac Low Risk Adult (1 of 2 - PCV13) Never done  . OPHTHALMOLOGY EXAM  02/07/2020  . INFLUENZA VACCINE  04/13/2020  . HEMOGLOBIN A1C  05/17/2020  . MAMMOGRAM  11/20/2021  . COLONOSCOPY  12/07/2022  . Hepatitis C Screening  Completed     Discussed health benefits of physical activity, and encouraged her to engage in regular exercise appropriate for her age and condition.    1. Welcome to Medicare preventive visit  - EKG 12-Lead  2. Encounter for screening colonoscopy Try probiotic daily - Ambulatory referral to Gastroenterology  3. Anxiety, generalized I will see her back in 1-2 months. - escitalopram (LEXAPRO) 20 MG tablet; Take 1 tablet (20 mg total) by mouth daily.  Dispense: 30 tablet; Refill: 0   Return in about 2 months (around 03/17/2020).        Cayenne Breault Wendelyn Breslow, MD  [facility] [phone] (phone) [phone] (fax)  [facility]

## 2020-01-16 NOTE — Patient Instructions (Addendum)
TRY PROBIOTIC DAILY!!!

## 2020-01-20 ENCOUNTER — Other Ambulatory Visit: Payer: Self-pay | Admitting: Family Medicine

## 2020-01-20 DIAGNOSIS — F411 Generalized anxiety disorder: Secondary | ICD-10-CM

## 2020-01-20 NOTE — Telephone Encounter (Signed)
Requested Prescriptions  Pending Prescriptions Disp Refills  . escitalopram (LEXAPRO) 20 MG tablet [Pharmacy Med Name: ESCITALOPRAM 20 MG TABLET] 90 tablet 0    Sig: TAKE 1 TABLET BY MOUTH EVERY DAY     Psychiatry:  Antidepressants - SSRI Passed - 01/20/2020 11:44 AM      Passed - Completed PHQ-2 or PHQ-9 in the last 360 days.      Passed - Valid encounter within last 6 months    Recent Outpatient Visits          4 days ago Welcome to [employer] preventive visit   [facility] Maple Hudson., MD   1 month ago Arthropathy of right temporomandibular joint   [facility] Maple Hudson., MD   1 month ago Jaw pain   [facility] Osvaldo Angst M, [place]   2 months ago Type 2 diabetes mellitus without complication, without long-term current use of insulin [facility])   [facility] Maple Hudson., MD   1 year ago Flu-like symptoms   [facility] Maple Hudson., MD      Future Appointments            In 2 months Maple Hudson., MD [facility]

## 2020-01-21 ENCOUNTER — Other Ambulatory Visit: Payer: Self-pay | Admitting: Family Medicine

## 2020-01-21 DIAGNOSIS — F411 Generalized anxiety disorder: Secondary | ICD-10-CM

## 2020-01-21 MED ORDER — ESCITALOPRAM OXALATE 20 MG PO TABS: 20.0000 mg | ORAL_TABLET | Freq: Every day | ORAL | 5 refills | Status: DC

## 2020-01-24 LAB — HM DIABETES EYE EXAM

## 2020-01-28 ENCOUNTER — Encounter: Payer: Self-pay | Admitting: *Deleted

## 2020-02-22 ENCOUNTER — Telehealth: Payer: Self-pay

## 2020-02-22 NOTE — Telephone Encounter (Signed)
Copied from CRM [id number]. Topic: General - Call Back - No Documentation >> Feb 22, 2020  2:32 PM Randol Kern wrote: Pt is requesting for her Handicap placard to be mailed to her home address (home address on file is correct) and she would also like a call back from office with status update on this request. Please advise   Best contact [phone]

## 2020-02-26 ENCOUNTER — Telehealth: Payer: Self-pay

## 2020-02-26 NOTE — Telephone Encounter (Signed)
Copied from CRM [id number]. Topic: General - Inquiry >> Feb 26, 2020 11:26 AM Leary Roca wrote: Reason for CRM: pt returning call from Angelica Ran . Pt states please LVM if no one answers .

## 2020-02-26 NOTE — Telephone Encounter (Signed)
Patient advised that handicap placard request form was denied by pcp.

## 2020-02-26 NOTE — Telephone Encounter (Signed)
Called to advise patient about handicap placard, LVMTCB.

## 2020-02-26 NOTE — Telephone Encounter (Signed)
Returned patients call.

## 2020-03-12 ENCOUNTER — Other Ambulatory Visit: Payer: Self-pay | Admitting: Family Medicine

## 2020-03-12 NOTE — Telephone Encounter (Signed)
Requested Prescriptions  Pending Prescriptions Disp Refills  . Verapamil HCl CR 300 MG CP24 [Pharmacy Med Name: VERAPAMIL ER PM 300 MG CAPSULE] 90 capsule 3    Sig: TAKE ONE (1) CAPSULE EACH DAY     Cardiovascular:  Calcium Channel Blockers Failed - 03/12/2020  1:13 AM      Failed - Last BP in normal range    BP Readings from Last 1 Encounters:  01/16/20 (!) 146/78         Passed - Valid encounter within last 6 months    Recent Outpatient Visits          1 month ago Welcome to [employer] preventive visit   [facility], Leonette Monarch., MD   3 months ago Arthropathy of right temporomandibular joint   [facility] Maple Hudson., MD   3 months ago Jaw pain   [facility] Osvaldo Angst M, [place]   3 months ago Type 2 diabetes mellitus without complication, without long-term current use of insulin [facility])   [facility] Maple Hudson., MD   1 year ago Flu-like symptoms   [facility] Maple Hudson., MD      Future Appointments            In 4 weeks Maple Hudson., MD [facility], [facility]           . omeprazole (PRILOSEC) 40 MG capsule [Pharmacy Med Name: OMEPRAZOLE DR 40 MG CAPSULE] 90 capsule 3    Sig: TAKE ONE (1) CAPSULE EACH DAY     Gastroenterology: Proton Pump Inhibitors Passed - 03/12/2020  1:13 AM      Passed - Valid encounter within last 12 months    Recent Outpatient Visits          1 month ago Welcome to [employer] preventive visit   [facility] Maple Hudson., MD   3 months ago Arthropathy of right temporomandibular joint   [facility] Maple Hudson., MD   3 months ago Jaw pain   [facility] Osvaldo Angst M, [place]   3 months ago Type 2 diabetes mellitus without complication, without long-term current use of insulin [facility])   [facility] Maple Hudson., MD   1 year  ago Flu-like symptoms   [facility] Maple Hudson., MD      Future Appointments            In 4 weeks Maple Hudson., MD [facility], [facility]

## 2020-03-19 ENCOUNTER — Other Ambulatory Visit: Payer: Self-pay | Admitting: Family Medicine

## 2020-03-19 DIAGNOSIS — E119 Type 2 diabetes mellitus without complications: Secondary | ICD-10-CM

## 2020-03-19 NOTE — Telephone Encounter (Signed)
Requested Prescriptions  Pending Prescriptions Disp Refills   sitaGLIPtin-metformin (JANUMET) 50-1000 MG tablet [Pharmacy Med Name: JANUMET 50-1,000 MG TABLET] 60 tablet 3    Sig: TAKE 1 TABLET BY MOUTH TWICE A DAY WITH FOOD     Endocrinology:  Diabetes - Biguanide + DPP-4 Inhibitor Combos Failed - 03/19/2020  1:27 AM      Failed - HBA1C is between 0 and 7.9 and within 180 days    Hemoglobin A1C  Date Value Ref Range Status  11/15/2019 9.8 (A) 4.0 - 5.6 % Final   Hgb A1c MFr Bld  Date Value Ref Range Status  04/25/2018 8.8 (H) 4.8 - 5.6 % Final    Comment:             Prediabetes: 5.7 - 6.4          Diabetes: >6.4          Glycemic control for adults with diabetes: <7.0          Passed - Cr in normal range and within 360 days    Creatinine, Ser  Date Value Ref Range Status  11/21/2019 0.70 0.57 - 1.00 mg/dL Final         Passed - eGFR in normal range and within 360 days    GFR calc Af Amer  Date Value Ref Range Status  11/21/2019 105 >59 mL/min/1.73 Final   GFR calc non Af Amer  Date Value Ref Range Status  11/21/2019 91 >59 mL/min/1.73 Final         Passed - Valid encounter within last 6 months    Recent Outpatient Visits          2 months ago Welcome to [employer] preventive visit   [facility] Jerrol Banana., MD   3 months ago Arthropathy of right temporomandibular joint   [facility] Jerrol Banana., MD   3 months ago Jaw pain   [facility] Carles Collet M, [place]   4 months ago Type 2 diabetes mellitus without complication, without long-term current use of insulin [facility])   [facility] Jerrol Banana., MD   1 year ago Flu-like symptoms   [facility] Jerrol Banana., MD      Future Appointments            In 3 weeks Jerrol Banana., MD [facility] [place]

## 2020-04-04 NOTE — Progress Notes (Signed)
Established patient visit  I,[name],acting as a scribe for [name], MD.,have documented all relevant documentation on the behalf of [name], MD,as directed by  [name], MD while in the presence of [name], MD.   [name]   DOB: 23-Jan-1956   64 y.o. Female  MRN: [id number] Visit Date: 04/09/2020  Today's healthcare provider: [name], MD   Chief Complaint  Patient presents with  . Anxiety  . Diabetes  . Follow-up  . Hypertension   Subjective    HPI   Patient has not been following diet and exercise for diabetes hypertension hyperlipidemia She has right hip pain with weightbearing and ambulation.  Overall she has been feeling fairly well.  Diabetes Mellitus Type II, follow-up  Lab Results  Component Value Date   HGBA1C 9.5 (A) 04/09/2020   HGBA1C 9.8 (A) 11/15/2019   HGBA1C 7.5 (A) 08/28/2018   Last seen for diabetes 4 months ago.  Management since then includes; Long history of poor compliance with follow-up. She reports good compliance with treatment. She is not having side effects. none  Home blood sugar records: fasting range: over 200  Episodes of hypoglycemia? No none   Current insulin regiment: n/a Most Recent Eye Exam: 01/24/2020  --------------------------------------------------------------------  Hypertension, follow-up  BP Readings from Last 3 Encounters:  04/09/20 (!) 131/74  01/16/20 (!) 146/78  12/05/19 134/62   Wt Readings from Last 3 Encounters:  04/09/20 (!) 214 lb (97.1 kg)  01/16/20 213 lb 12.8 oz (97 kg)  12/05/19 217 lb (98.4 kg)     She was last seen for hypertension 4 months ago.  BP at that visit was 134/62. Management since that visit includes; on valsartan. She reports good compliance with treatment. She is not having side effects. none She is not exercising. She is adherent to low salt diet.   Outside blood pressures are not checking.  She does  not smoke.  Use of agents associated with hypertension: none.   --------------------------------------------------------------------  Lipid/Cholesterol, follow-up  Last Lipid Panel: Lab Results  Component Value Date   CHOL 153 11/21/2019   LDLCALC 79 11/21/2019   HDL 47 11/21/2019   TRIG 160 (H) 11/21/2019    She was last seen for this 4 months ago.  Management since that visit includes; Consider statin in the future, last LDL was 79. She reports good compliance with treatment. She is not having side effects. none She is following a Regular diet. Current exercise: none  Last metabolic panel Lab Results  Component Value Date   GLUCOSE 198 (H) 11/21/2019   NA 135 11/21/2019   K 4.6 11/21/2019   BUN 10 11/21/2019   CREATININE 0.70 11/21/2019   GFRNONAA 91 11/21/2019   GFRAA 105 11/21/2019   CALCIUM 9.3 11/21/2019   AST 31 11/21/2019   ALT 23 11/21/2019   The 10-year ASCVD risk score Denman George DC Jr., et al., 2013) is: 15.1%  --------------------------------------------------------------------  Anxiety, Follow-up  She was last seen for anxiety 4 months ago. Changes made at last visit include; Chronic but relatively stable condition. She reports good compliance with treatment. She reports good tolerance of treatment. She is not having side effects.   She feels her anxiety is mild and Unchanged since last visit.  Symptoms:   No difficulty concentrating   No fatigue   No       insomnia   No palpitations   No racing thoughts   No  sweating      GAD-7 Results No flowsheet data found.  PHQ-9 Scores PHQ9 SCORE ONLY 01/16/2020 10/31/2018 07/27/2017  PHQ-9 Total Score 0 0 0    --------------------------------------------------------------------   BMI 30.0-30.9,adult From 12/05/2019-Diet and exercise has been stressed.       Medications: Outpatient Medications Prior to Visit  Medication Sig  . escitalopram (LEXAPRO) 20 MG tablet Take 1 tablet (20 mg  total) by mouth daily.  Marland Kitchen escitalopram (LEXAPRO) 20 MG tablet TAKE 1 TABLET BY MOUTH EVERY DAY  . glucose blood (ACCU-CHEK COMPACT STRIPS) test strip Check sugar once daily-need QS 30 day supply. DX E11.9  . meclizine (ANTIVERT) 25 MG tablet Take 1 tablet (25 mg total) by mouth 3 (three) times daily as needed for dizziness.  Marland Kitchen omeprazole (PRILOSEC) 40 MG capsule TAKE ONE (1) CAPSULE EACH DAY  . sitaGLIPtin-metformin (JANUMET) 50-1000 MG tablet TAKE 1 TABLET BY MOUTH TWICE A DAY WITH FOOD  . valsartan (DIOVAN) 40 MG tablet Take 1 tablet (40 mg total) by mouth daily.  . Verapamil HCl CR 300 MG CP24 TAKE ONE (1) CAPSULE EACH DAY  . fluticasone (FLONASE) 50 MCG/ACT nasal spray Place 2 sprays into both nostrils daily. (Patient not taking: Reported on 01/16/2020)  . glucose blood (ONE TOUCH ULTRA TEST) test strip Check sugar once daily. DX E11.9 (Patient not taking: Reported on 01/16/2020)  . glucose blood (ONETOUCH VERIO) test strip Use as instructed (Patient not taking: Reported on 01/16/2020)   No facility-administered medications prior to visit.    Review of Systems  Constitutional: Negative for appetite change, chills, fatigue and fever.  Respiratory: Negative for chest tightness and shortness of breath.   Cardiovascular: Negative for chest pain and palpitations.  Gastrointestinal: Negative for abdominal pain, nausea and vomiting.  Neurological: Negative for dizziness and weakness.       Objective    BP (!) 131/74 (BP Location: Right Arm, Patient Position: Sitting, Cuff Size: Large)   Pulse 76   Temp (!) 96.8 F (36 C) (Other (Comment))   Resp 18   Ht 5\' 5"  (1.651 m)   Wt (!) 214 lb (97.1 kg)   SpO2 95%   BMI 35.61 kg/m  BP Readings from Last 3 Encounters:  04/09/20 (!) 131/74  01/16/20 (!) 146/78  12/05/19 134/62   Wt Readings from Last 3 Encounters:  04/09/20 (!) 214 lb (97.1 kg)  01/16/20 213 lb 12.8 oz (97 kg)  12/05/19 217 lb (98.4 kg)      Physical Exam Vitals  reviewed.  Constitutional:      Appearance: Normal appearance. She is well-developed.  HENT:     Head: Normocephalic and atraumatic.     Comments: She has minimal tenderness of the right TMJ area with no swelling or crepitance.    Right Ear: Tympanic membrane and external ear normal.     Left Ear: Tympanic membrane and external ear normal.     Nose: Nose normal.     Mouth/Throat:     Pharynx: Oropharynx is clear.  Eyes:     General: No scleral icterus.    Conjunctiva/sclera: Conjunctivae normal.  Neck:     Thyroid: No thyromegaly.  Cardiovascular:     Rate and Rhythm: Normal rate and regular rhythm.     Heart sounds: Normal heart sounds.  Pulmonary:     Effort: Pulmonary effort is normal.     Breath sounds: Normal breath sounds.  Abdominal:     Palpations: Abdomen is soft.  Musculoskeletal:  Cervical back: Normal range of motion.  Skin:    General: Skin is warm and dry.  Neurological:     General: No focal deficit present.     Mental Status: She is alert and oriented to person, place, and time.     Cranial Nerves: No cranial nerve deficit.  Psychiatric:        Mood and Affect: Mood normal.        Behavior: Behavior normal.        Thought Content: Thought content normal.        Judgment: Judgment normal.       Results for orders placed or performed in visit on 04/09/20  POCT HgB A1C  Result Value Ref Range   Hemoglobin A1C 9.5 (A) 4.0 - 5.6 %   Est. average glucose Bld gHb Est-mCnc 226     Assessment & Plan      1. Type 2 diabetes mellitus without complication, without long-term current use of insulin (HCC) A1c is 9.5.  Poor control.  Janumet is $400 a month so we will go to Metformin today at maximum dose and patient is to work hard on diet and exercise which she has not been doing.  Would add pioglitazone next.  Follow-up 3 months. - POCT HgB A1C - metFORMIN (GLUCOPHAGE) 1000 MG tablet; Take 1 tablet (1,000 mg total) by mouth in the morning and at bedtime.   Dispense: 60 tablet; Refill: 11  2. Pure hypercholesterolemia Patient states she is intolerant to statin.  3. BMI 30.0-30.9,adult Diet and exercise has been discussed at length.  4. Anxiety, generalized Chronic  5. Right hip pain X-ray right hip.  May need Ortho referral. - DG Hip Unilat W OR W/O Pelvis 2-3 Views Right  6. Essential (primary) hypertension On valsartan  7. 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          Terique Kawabata Wendelyn Breslow, MD  [facility] [phone] (phone) [phone] (fax)  [facility]

## 2020-04-09 ENCOUNTER — Ambulatory Visit
Admission: RE | Admit: 2020-04-09 | Discharge: 2020-04-09 | Disposition: A | Discharge status: Home / Self Care | Payer: Medicare HMO | Source: Ambulatory Visit | Attending: Family Medicine | Admitting: Family Medicine

## 2020-04-09 ENCOUNTER — Ambulatory Visit
Admission: RE | Admit: 2020-04-09 | Discharge: 2020-04-09 | Disposition: A | Discharge status: Home / Self Care | Payer: Medicare HMO | Attending: Family Medicine | Admitting: Family Medicine

## 2020-04-09 ENCOUNTER — Other Ambulatory Visit: Payer: Self-pay

## 2020-04-09 ENCOUNTER — Ambulatory Visit (INDEPENDENT_AMBULATORY_CARE_PROVIDER_SITE_OTHER): Payer: Medicare HMO | Admitting: Family Medicine

## 2020-04-09 ENCOUNTER — Encounter: Payer: Self-pay | Admitting: Family Medicine

## 2020-04-09 VITALS — BP 131/74 | HR 76 | Temp 96.8°F | Resp 18 | Ht 65.0 in | Wt 214.0 lb

## 2020-04-09 DIAGNOSIS — M25551 Pain in right hip: Secondary | ICD-10-CM

## 2020-04-09 DIAGNOSIS — Z6830 Body mass index (BMI) 30.0-30.9, adult: Secondary | ICD-10-CM

## 2020-04-09 DIAGNOSIS — F411 Generalized anxiety disorder: Secondary | ICD-10-CM | POA: Diagnosis not present

## 2020-04-09 DIAGNOSIS — E7800 Pure hypercholesterolemia, unspecified: Secondary | ICD-10-CM | POA: Diagnosis not present

## 2020-04-09 DIAGNOSIS — I10 Essential (primary) hypertension: Secondary | ICD-10-CM

## 2020-04-09 DIAGNOSIS — F330 Major depressive disorder, recurrent, mild: Secondary | ICD-10-CM

## 2020-04-09 DIAGNOSIS — E119 Type 2 diabetes mellitus without complications: Secondary | ICD-10-CM | POA: Diagnosis not present

## 2020-04-09 LAB — POCT GLYCOSYLATED HEMOGLOBIN (HGB A1C)
Est. average glucose Bld gHb Est-mCnc: 226
Hemoglobin A1C: 9.5 % — AB (ref 4.0–5.6)

## 2020-04-09 MED ORDER — METFORMIN HCL 1000 MG PO TABS: 1000.0000 mg | ORAL_TABLET | Freq: Two times a day (BID) | ORAL | 11 refills | Status: DC

## 2020-04-09 NOTE — Patient Instructions (Signed)
FINISH JANUMET!!!  START METFORMIN 1,000MG  TWICE A DAY!!!

## 2020-04-15 ENCOUNTER — Other Ambulatory Visit: Payer: Self-pay | Admitting: Family Medicine

## 2020-04-15 DIAGNOSIS — F411 Generalized anxiety disorder: Secondary | ICD-10-CM

## 2020-06-05 ENCOUNTER — Other Ambulatory Visit: Payer: Self-pay | Admitting: Family Medicine

## 2020-06-05 DIAGNOSIS — I10 Essential (primary) hypertension: Secondary | ICD-10-CM

## 2020-06-06 ENCOUNTER — Other Ambulatory Visit: Payer: Self-pay | Admitting: Family Medicine

## 2020-06-08 ENCOUNTER — Other Ambulatory Visit: Payer: Self-pay | Admitting: Family Medicine

## 2020-06-08 DIAGNOSIS — F411 Generalized anxiety disorder: Secondary | ICD-10-CM

## 2020-06-09 ENCOUNTER — Other Ambulatory Visit: Payer: Self-pay | Admitting: Family Medicine

## 2020-06-09 DIAGNOSIS — F411 Generalized anxiety disorder: Secondary | ICD-10-CM

## 2020-07-15 ENCOUNTER — Other Ambulatory Visit: Payer: Self-pay | Admitting: Family Medicine

## 2020-07-15 DIAGNOSIS — F411 Generalized anxiety disorder: Secondary | ICD-10-CM

## 2020-07-15 NOTE — Telephone Encounter (Signed)
Requested Prescriptions  Pending Prescriptions Disp Refills   escitalopram (LEXAPRO) 20 MG tablet [Pharmacy Med Name: ESCITALOPRAM 20 MG TABLET] 90 tablet 0    Sig: TAKE 1 TABLET BY MOUTH EVERY DAY     Psychiatry:  Antidepressants - SSRI Passed - 07/15/2020  3:09 PM      Passed - Completed PHQ-2 or PHQ-9 in the last 360 days      Passed - Valid encounter within last 6 months    Recent Outpatient Visits          3 months ago Type 2 diabetes mellitus without complication, without long-term current use of insulin [facility])   [facility] Maple Hudson., MD   6 months ago Welcome to [employer] preventive visit   [facility] Maple Hudson., MD   7 months ago Arthropathy of right temporomandibular joint   [facility] Maple Hudson., MD   7 months ago Jaw pain   [facility] Osvaldo Angst M, [place]   8 months ago Type 2 diabetes mellitus without complication, without long-term current use of insulin [facility])   [facility] Maple Hudson., MD      Future Appointments            In 3 weeks Maple Hudson., MD [facility]

## 2020-08-04 LAB — HM DIABETES EYE EXAM

## 2020-08-11 ENCOUNTER — Ambulatory Visit: Payer: Self-pay | Admitting: Family Medicine

## 2020-08-31 ENCOUNTER — Other Ambulatory Visit: Payer: Self-pay | Admitting: Family Medicine

## 2020-08-31 NOTE — Telephone Encounter (Signed)
Requested Prescriptions  Pending Prescriptions Disp Refills   Verapamil HCl CR 300 MG CP24 [Pharmacy Med Name: VERAPAMIL ER PM 300 MG CAPSULE] 90 capsule 0    Sig: TAKE ONE (1) CAPSULE EACH DAY     Cardiovascular:  Calcium Channel Blockers Passed - 08/31/2020  8:57 AM      Passed - Last BP in normal range    BP Readings from Last 1 Encounters:  04/09/20 (!) 131/74         Passed - Valid encounter within last 6 months    Recent Outpatient Visits          4 months ago Type 2 diabetes mellitus without complication, without long-term current use of insulin [facility])   [facility] Maple Hudson., MD   7 months ago Welcome to [employer] preventive visit   [facility] Maple Hudson., MD   9 months ago Arthropathy of right temporomandibular joint   [facility] Maple Hudson., MD   9 months ago Jaw pain   [facility] Osvaldo Angst M, [place]   9 months ago Type 2 diabetes mellitus without complication, without long-term current use of insulin [facility])   [facility] Maple Hudson., MD

## 2020-09-02 ENCOUNTER — Other Ambulatory Visit: Payer: Self-pay | Admitting: Family Medicine

## 2020-09-02 DIAGNOSIS — F411 Generalized anxiety disorder: Secondary | ICD-10-CM

## 2020-10-31 ENCOUNTER — Other Ambulatory Visit: Payer: Self-pay | Admitting: Family Medicine

## 2020-10-31 DIAGNOSIS — I10 Essential (primary) hypertension: Secondary | ICD-10-CM

## 2020-11-22 ENCOUNTER — Other Ambulatory Visit: Payer: Self-pay | Admitting: Family Medicine

## 2020-11-22 DIAGNOSIS — F411 Generalized anxiety disorder: Secondary | ICD-10-CM

## 2020-11-22 NOTE — Telephone Encounter (Signed)
MyChart message sent to pt to make appt for f/u. 30 day courtesy  Refill given.

## 2020-11-30 ENCOUNTER — Other Ambulatory Visit: Payer: Self-pay | Admitting: Family Medicine

## 2020-11-30 NOTE — Telephone Encounter (Signed)
MyChart message sent to pt to call office and schedule overdue appt.  Last RF: 10/31/20  Future visit schedule: no Active med list: yes RF due: yes

## 2020-12-02 ENCOUNTER — Other Ambulatory Visit: Payer: Self-pay | Admitting: Family Medicine

## 2020-12-02 DIAGNOSIS — I10 Essential (primary) hypertension: Secondary | ICD-10-CM

## 2020-12-02 NOTE — Telephone Encounter (Signed)
Requested medication (s) are due for refill today: yes  Requested medication (s) are on the active medication list: yes  Last refill: 11/09/2020  Future visit scheduled: no  Notes to clinic:  courtesy refill has been given and no appt scheduled    Requested Prescriptions  Pending Prescriptions Disp Refills   valsartan (DIOVAN) 40 MG tablet [Pharmacy Med Name: VALSARTAN 40 MG TABLET] 30 tablet 0    Sig: TAKE 1 TABLET BY MOUTH EVERY DAY      Cardiovascular:  Angiotensin Receptor Blockers Failed - 12/02/2020  9:31 AM      Failed - Cr in normal range and within 180 days    Creatinine, Ser  Date Value Ref Range Status  11/21/2019 0.70 0.57 - 1.00 mg/dL Final          Failed - K in normal range and within 180 days    Potassium  Date Value Ref Range Status  11/21/2019 4.6 3.5 - 5.2 mmol/L Final          Failed - Valid encounter within last 6 months    Recent Outpatient Visits           7 months ago Type 2 diabetes mellitus without complication, without long-term current use of insulin [facility])   [facility] Maple Hudson., MD   10 months ago Welcome to [employer] preventive visit   [facility] Maple Hudson., MD   12 months ago Arthropathy of right temporomandibular joint   [facility] Maple Hudson., MD   1 year ago Jaw pain   [facility]do Angst M, [place]   1 year ago Type 2 diabetes mellitus without complication, without long-term current use of insulin [facility])   [facility] Maple Hudson., MD                Passed - Patient is not pregnant      Passed - Last BP in normal range    BP Readings from Last 1 Encounters:  04/09/20 (!) 131/74

## 2020-12-02 NOTE — Telephone Encounter (Signed)
Per Dr. Sullivan Lone after courtesy refills this month,  No more refills until seen.

## 2020-12-02 NOTE — Telephone Encounter (Signed)
   Notes to clinic: Patient has not scheduled follow up appointment  Courtesy given    Requested Prescriptions  Pending Prescriptions Disp Refills   omeprazole (PRILOSEC) 40 MG capsule [Pharmacy Med Name: OMEPRAZOLE DR 40 MG CAPSULE] 30 capsule 0    Sig: TAKE 1 CAPSULE BY MOUTH EACH DAY      Gastroenterology: Proton Pump Inhibitors Passed - 12/02/2020  9:32 AM      Passed - Valid encounter within last 12 months    Recent Outpatient Visits           7 months ago Type 2 diabetes mellitus without complication, without long-term current use of insulin [facility])   [facility] Maple Hudson., MD   10 months ago Welcome to [employer] preventive visit   [facility] Maple Hudson., MD   12 months ago Arthropathy of right temporomandibular joint   [facility] Maple Hudson., MD   1 year ago Jaw pain   [facility] Osvaldo Angst M, [place]   1 year ago Type 2 diabetes mellitus without complication, without long-term current use of insulin [facility])   [facility] Maple Hudson., MD

## 2020-12-17 ENCOUNTER — Other Ambulatory Visit: Payer: Self-pay | Admitting: Family Medicine

## 2020-12-17 DIAGNOSIS — F411 Generalized anxiety disorder: Secondary | ICD-10-CM

## 2020-12-17 NOTE — Telephone Encounter (Signed)
   Last refill:  11/22/2020  Future visit scheduled: no  Notes to clinic:  Patient has already been giving a courtesy refill and has not schedule yet   Requested Prescriptions  Pending Prescriptions Disp Refills   escitalopram (LEXAPRO) 20 MG tablet [Pharmacy Med Name: ESCITALOPRAM 20 MG TABLET] 90 tablet 1    Sig: TAKE 1 TABLET BY MOUTH EVERY DAY      Psychiatry:  Antidepressants - SSRI Failed - 12/17/2020  8:33 AM      Failed - Valid encounter within last 6 months    Recent Outpatient Visits           8 months ago Type 2 diabetes mellitus without complication, without long-term current use of insulin [facility])   [facility] Maple Hudson., MD   11 months ago Welcome to [employer] preventive visit   [facility] Maple Hudson., MD   1 year ago Arthropathy of right temporomandibular joint   [facility] Maple Hudson., MD   1 year ago Jaw pain   [facility] Osvaldo Angst M, [place]   1 year ago Type 2 diabetes mellitus without complication, without long-term current use of insulin [facility])   [facility] Maple Hudson., MD                Passed - Completed PHQ-2 or PHQ-9 in the last 360 days

## 2020-12-23 ENCOUNTER — Other Ambulatory Visit: Payer: Self-pay | Admitting: Family Medicine

## 2020-12-31 ENCOUNTER — Other Ambulatory Visit: Payer: Self-pay | Admitting: Family Medicine

## 2020-12-31 DIAGNOSIS — I10 Essential (primary) hypertension: Secondary | ICD-10-CM

## 2021-01-12 ENCOUNTER — Other Ambulatory Visit: Payer: Self-pay | Admitting: Family Medicine

## 2021-01-12 NOTE — Telephone Encounter (Signed)
Requested medications are due for refill today.  yes  Requested medications are on the active medications list.  Yes  Last refill. 12/01/2020  Future visit scheduled.   no  Notes to clinic.  Last seen 04/08/2020 - More than 3 months overdue.

## 2021-01-16 ENCOUNTER — Other Ambulatory Visit: Payer: Self-pay | Admitting: Family Medicine

## 2021-01-16 DIAGNOSIS — I10 Essential (primary) hypertension: Secondary | ICD-10-CM

## 2021-02-12 ENCOUNTER — Other Ambulatory Visit: Payer: Self-pay | Admitting: Family Medicine

## 2021-02-12 DIAGNOSIS — I10 Essential (primary) hypertension: Secondary | ICD-10-CM

## 2021-02-12 NOTE — Telephone Encounter (Signed)
Requested medication (s) are due for refill today: yes, needs appt  Requested medication (s) are on the active medication list: yes  Last refill:  01/16/21 #30 0 refills  Future visit scheduled: no  Notes to clinic:  patient needs appt . Called patient to schedule appt. No answer, LVMTCB. Do you want a 2nd courtesy refill?      Requested Prescriptions  Pending Prescriptions Disp Refills   valsartan (DIOVAN) 40 MG tablet [Pharmacy Med Name: VALSARTAN 40 MG TABLET] 30 tablet 0    Sig: TAKE 1 TABLET BY MOUTH EVERY DAY      Cardiovascular:  Angiotensin Receptor Blockers Failed - 02/12/2021 12:31 PM      Failed - Cr in normal range and within 180 days    Creatinine, Ser  Date Value Ref Range Status  11/21/2019 0.70 0.57 - 1.00 mg/dL Final          Failed - K in normal range and within 180 days    Potassium  Date Value Ref Range Status  11/21/2019 4.6 3.5 - 5.2 mmol/L Final          Failed - Valid encounter within last 6 months    Recent Outpatient Visits           10 months ago Type 2 diabetes mellitus without complication, without long-term current use of insulin [facility])   [facility] Maple Hudson., MD   1 year ago [facility]   [facility] Maple Hudson., MD   1 year ago Arthropathy of right temporomandibular joint   [facility] Maple Hudson., MD   1 year ago Jaw pain   [facility] Osvaldo Angst M, [place]   1 year ago Type 2 diabetes mellitus without complication, without long-term current use of insulin [facility])   [facility] Maple Hudson., MD                Passed - Patient is not pregnant      Passed - Last BP in normal range    BP Readings from Last 1 Encounters:  04/09/20 (!) 131/74

## 2021-02-24 ENCOUNTER — Other Ambulatory Visit: Payer: Self-pay

## 2021-02-24 ENCOUNTER — Telehealth: Payer: Self-pay | Admitting: Family Medicine

## 2021-02-24 NOTE — Telephone Encounter (Signed)
Medication Refill - Medication: Pt called to request lancets and test strips for her glucometer (ONETOUCH ULTRA MINI)   Has the patient contacted their pharmacy? Yes.   (Agent: If no, request that the patient contact the pharmacy for the refill.) (Agent: If yes, when and what did the pharmacy advise?)  Preferred Pharmacy (with phone number or street name):  [id number] - [place], [place] - [address]  [address] [place] [address]  Phone: [phone] Fax: [phone]    Agent: Please be advised that RX refills may take up to 3 business days. We ask that you follow-up with your pharmacy.

## 2021-02-27 MED ORDER — ONETOUCH VERIO VI STRP: ORAL_STRIP | 12 refills | Status: DC

## 2021-02-27 MED ORDER — ONETOUCH ULTRA BLUE VI STRP: ORAL_STRIP | 12 refills | Status: DC

## 2021-03-06 ENCOUNTER — Other Ambulatory Visit: Payer: Self-pay | Admitting: Family Medicine

## 2021-03-06 NOTE — Telephone Encounter (Signed)
Requested medication (s) are due for refill today:no  Requested medication (s) are on the active medication list: yes   Last refill: 12/01/2020  Future visit scheduled: yes   Notes to clinic: medication last filled on 12/01/2020 For a 30 day supply   Requested Prescriptions  Pending Prescriptions Disp Refills   omeprazole (PRILOSEC) 40 MG capsule [Pharmacy Med Name: OMEPRAZOLE DR 40 MG CAPSULE] 30 capsule 0    Sig: TAKE 1 CAPSULE BY MOUTH EVERY DAY      Gastroenterology: Proton Pump Inhibitors Passed - 03/06/2021 11:33 AM      Passed - Valid encounter within last 12 months    Recent Outpatient Visits           11 months ago Type 2 diabetes mellitus without complication, without long-term current use of insulin (HCC)   [facility] Maple Hudson., MD   1 year ago [facility]   [facility] Maple Hudson., MD   1 year ago Arthropathy of right temporomandibular joint   [facility] Maple Hudson., MD   1 year ago Jaw pain   [facility] Osvaldo Angst M, [place]   1 year ago Type 2 diabetes mellitus without complication, without long-term current use of insulin [facility])   [facility] Maple Hudson., MD       Future Appointments             In 2 weeks Maple Hudson., MD [facility]               Verapamil HCl CR 300 MG CP24 [Pharmacy Med Name: VERAPAMIL ER PM 300 MG CAPSULE] 30 capsule 0    Sig: TAKE 1 CAPSULE BY MOUTH ONCE DAILY      Cardiovascular:  Calcium Channel Blockers Failed - 03/06/2021 11:33 AM      Failed - Valid encounter within last 6 months    Recent Outpatient Visits           11 months ago Type 2 diabetes mellitus without complication, without long-term current use of insulin [facility])   [facility] Maple Hudson., MD   1 year ago [facility] visit   [facility] Maple Hudson., MD   1 year ago Arthropathy of right temporomandibular joint   [facility] Maple Hudson., MD   1 year ago Jaw pain   [facility] Osvaldo Angst M, [place]   1 year ago Type 2 diabetes mellitus without complication, without long-term current use of insulin [facility])   [facility] Maple Hudson., MD       Future Appointments             In 2 weeks Maple Hudson., MD [facility], [facility]             Passed - Last BP in normal range    BP Readings from Last 1 Encounters:  04/09/20 (!) 131/74

## 2021-03-11 ENCOUNTER — Other Ambulatory Visit: Payer: Self-pay | Admitting: Family Medicine

## 2021-03-11 DIAGNOSIS — I10 Essential (primary) hypertension: Secondary | ICD-10-CM

## 2021-03-11 NOTE — Telephone Encounter (Signed)
}    Notes to clinic:  Patient has appt on 03/25/2021 Review for another refill until that time    Requested Prescriptions  Pending Prescriptions Disp Refills   valsartan (DIOVAN) 40 MG tablet [Pharmacy Med Name: VALSARTAN 40 MG TABLET] 30 tablet 0    Sig: TAKE 1 TABLET BY MOUTH EVERY DAY      Cardiovascular:  Angiotensin Receptor Blockers Failed - 03/11/2021 11:32 AM      Failed - Cr in normal range and within 180 days    Creatinine, Ser  Date Value Ref Range Status  11/21/2019 0.70 0.57 - 1.00 mg/dL Final          Failed - K in normal range and within 180 days    Potassium  Date Value Ref Range Status  11/21/2019 4.6 3.5 - 5.2 mmol/L Final          Failed - Valid encounter within last 6 months    Recent Outpatient Visits           11 months ago Type 2 diabetes mellitus without complication, without long-term current use of insulin [facility])   [facility] Maple Hudson., MD   1 year ago [facility] visit   [facility] Maple Hudson., MD   1 year ago Arthropathy of right temporomandibular joint   [facility] Maple Hudson., MD   1 year ago Jaw pain   [facility] Osvaldo Angst M, [place]   1 year ago Type 2 diabetes mellitus without complication, without long-term current use of insulin [facility])   [facility] Maple Hudson., MD       Future Appointments             In 2 weeks Maple Hudson., MD [facility]             Passed - Patient is not pregnant      Passed - Last BP in normal range    BP Readings from Last 1 Encounters:  04/09/20 (!) 131/74

## 2021-03-25 ENCOUNTER — Telehealth: Payer: Self-pay | Admitting: Family Medicine

## 2021-03-25 ENCOUNTER — Ambulatory Visit (INDEPENDENT_AMBULATORY_CARE_PROVIDER_SITE_OTHER): Payer: Medicare HMO | Admitting: Family Medicine

## 2021-03-25 ENCOUNTER — Other Ambulatory Visit: Payer: Self-pay

## 2021-03-25 ENCOUNTER — Encounter: Payer: Self-pay | Admitting: Family Medicine

## 2021-03-25 VITALS — BP 135/74 | HR 69 | Temp 98.3°F | Resp 16 | Ht 65.0 in | Wt 207.0 lb

## 2021-03-25 DIAGNOSIS — F330 Major depressive disorder, recurrent, mild: Secondary | ICD-10-CM | POA: Diagnosis not present

## 2021-03-25 DIAGNOSIS — I10 Essential (primary) hypertension: Secondary | ICD-10-CM

## 2021-03-25 DIAGNOSIS — E7800 Pure hypercholesterolemia, unspecified: Secondary | ICD-10-CM

## 2021-03-25 DIAGNOSIS — E119 Type 2 diabetes mellitus without complications: Secondary | ICD-10-CM

## 2021-03-25 LAB — POCT GLYCOSYLATED HEMOGLOBIN (HGB A1C)
Est. average glucose Bld gHb Est-mCnc: 229
Hemoglobin A1C: 9.6 % — AB (ref 4.0–5.6)

## 2021-03-25 MED ORDER — PIOGLITAZONE HCL 15 MG PO TABS: 15.0000 mg | ORAL_TABLET | Freq: Every day | ORAL | 3 refills | Status: DC

## 2021-03-25 NOTE — Telephone Encounter (Signed)
Patient would like to know from Dr Sullivan Lone if she should keep taking the Metformin along with the Actos Ph# [phone]

## 2021-03-25 NOTE — Progress Notes (Signed)
I,[name],acting as a scribe for [name], MD.,have documented all relevant documentation on the behalf of [name], MD,as directed by  [name], MD while in the presence of [name], MD.   Established patient visit   [name]   DOB: Feb 20, 1956   65 y.o. Female  MRN: [id number] Visit Date: 03/25/2021  Today's healthcare provider: [name], MD   Chief Complaint  Patient presents with   Follow-up   Hypertension   Hyperlipidemia   Depression   Subjective    HPI  Patient is a very delightful but completely noncompliant patient with follow-up comes in today for follow-up as a would not refill her meds without her coming in.  She is unvaccinated for COVID and had COVID back in February 2022. She had her right knee evaluated 2 weeks ago by Dr. Wyline Mood and had her right hip injected yesterday by different orthopedic surgeon. Overall other than right leg pain she is feeling fairly well. Diabetes Mellitus Type II, follow-up  Lab Results  Component Value Date   HGBA1C 9.6 (A) 03/25/2021   HGBA1C 9.5 (A) 04/09/2020   HGBA1C 9.8 (A) 11/15/2019   Last seen for diabetes 1 years ago.  Management since then includes; Poor control.  Janumet is $400 a month so we will go to Metformin today at maximum dose and patient is to work hard on diet and exercise which she has not been doing.  Would add pioglitazone next.  Follow-up 3 months. She reports poor compliance with treatment. She is not having side effects. none  Home blood sugar records: fasting range: 165-285  Episodes of hypoglycemia? No none   Current insulin regiment: n/a Most Recent Eye Exam: 08/04/2020  ---------------------------------------------------------------------------- Hypertension, follow-up  BP Readings from Last 3 Encounters:  03/25/21 135/74  04/09/20 (!) 131/74  01/16/20 (!) 146/78   Wt Readings from Last 3 Encounters:  03/25/21 207 lb  (93.9 kg)  04/09/20 (!) 214 lb (97.1 kg)  01/16/20 213 lb 12.8 oz (97 kg)     She was last seen for hypertension 1 years ago.  BP at that visit was 131/74. Management since that visit includes; On valsartan. She reports fair compliance with treatment. She is not having side effects. none She is not exercising. She is not adherent to low salt diet.   Outside blood pressures are checks occasionally.  She does not smoke.  Use of agents associated with hypertension: none.   ---------------------------------------------------------------------------- Lipid/Cholesterol, follow-up  Last Lipid Panel: Lab Results  Component Value Date   CHOL 153 11/21/2019   LDLCALC 79 11/21/2019   HDL 47 11/21/2019   TRIG 160 (H) 11/21/2019    She was last seen for this 11/21/2019.  Management since that visit includes; not on a medication.  She reports fair compliance with treatment. She is not having side effects. none  She is following a Regular diet. Current exercise: none  Last metabolic panel Lab Results  Component Value Date   GLUCOSE 198 (H) 11/21/2019   NA 135 11/21/2019   K 4.6 11/21/2019   BUN 10 11/21/2019   CREATININE 0.70 11/21/2019   GFRNONAA 91 11/21/2019   GFRAA 105 11/21/2019   CALCIUM 9.3 11/21/2019   AST 31 11/21/2019   ALT 23 11/21/2019   The 10-year ASCVD risk score Denman George DC Jr., et al., 2013) is: 17.7%  ---------------------------------------------------------------------------- Depression, Follow-up  She  was last seen for this 1 years ago. Changes made  at last visit include; In partial remission on escitalopram.   She reports good compliance with treatment. She is not having side effects. none  She reports good tolerance of treatment. Current symptoms include:  n/a She feels she is Improved since last visit.  Depression screen [facility] 2/9 01/16/2020 10/31/2018 07/27/2017  Decreased Interest 0 0 0  Down, Depressed, Hopeless 0 0 0  PHQ - 2 Score 0 0 0   Altered sleeping 0 - 0  Tired, decreased energy 0 - 0  Change in appetite 0 - 0  Feeling bad or failure about yourself  0 - 0  Trouble concentrating 0 - 0  Moving slowly or fidgety/restless 0 - 0  Suicidal thoughts 0 - 0  PHQ-9 Score 0 - 0  Difficult doing work/chores Not difficult at all - Not difficult at all    ----------------------------------------------------------------------------       Medications: Outpatient Medications Prior to Visit  Medication Sig   escitalopram (LEXAPRO) 20 MG tablet Take 1 tablet (20 mg total) by mouth daily. (Patient taking differently: Take 20 mg by mouth daily. Takes 1/2 Tablet every other day)   glucose blood (ACCU-CHEK COMPACT STRIPS) test strip Check sugar once daily-need QS 30 day supply. DX E11.9   glucose blood (ONE TOUCH ULTRA TEST) test strip Check sugar once daily. DX E11.9   meclizine (ANTIVERT) 25 MG tablet Take 1 tablet (25 mg total) by mouth 3 (three) times daily as needed for dizziness.   meloxicam (MOBIC) 7.5 MG tablet Take 7.5 mg by mouth daily.   metFORMIN (GLUCOPHAGE) 1000 MG tablet Take 1 tablet (1,000 mg total) by mouth in the morning and at bedtime.   omeprazole (PRILOSEC) 40 MG capsule TAKE 1 CAPSULE BY MOUTH EVERY DAY   tizanidine (ZANAFLEX) 2 MG capsule Take 4 mg by mouth every 8 (eight) hours as needed.   valsartan (DIOVAN) 40 MG tablet TAKE 1 TABLET BY MOUTH EVERY DAY   Verapamil HCl CR 300 MG CP24 TAKE 1 CAPSULE BY MOUTH ONCE DAILY   fluticasone (FLONASE) 50 MCG/ACT nasal spray Place 2 sprays into both nostrils daily. (Patient not taking: No sig reported)   glucose blood (ONETOUCH VERIO) test strip Use as instructed (Patient not taking: Reported on 03/25/2021)   sitaGLIPtin-metformin (JANUMET) 50-1000 MG tablet TAKE 1 TABLET BY MOUTH TWICE A DAY WITH FOOD (Patient not taking: Reported on 03/25/2021)   [DISCONTINUED] escitalopram (LEXAPRO) 20 MG tablet TAKE 1 TABLET BY MOUTH EVERY DAY (Patient not taking: Reported on  03/25/2021)   No facility-administered medications prior to visit.    Review of Systems  Constitutional:  Negative for appetite change, chills, fatigue and fever.  Respiratory:  Negative for chest tightness and shortness of breath.   Cardiovascular:  Negative for chest pain and palpitations.  Gastrointestinal:  Negative for abdominal pain, nausea and vomiting.  Neurological:  Negative for dizziness and weakness.       Objective    BP 135/74 (BP Location: Left Arm, Patient Position: Sitting, Cuff Size: Large)   Pulse 69   Temp 98.3 F (36.8 C) (Oral)   Resp 16   Ht 5\' 5"  (1.651 m)   Wt 207 lb (93.9 kg)   SpO2 97%   BMI 34.45 kg/m  BP Readings from Last 3 Encounters:  03/25/21 135/74  04/09/20 (!) 131/74  01/16/20 (!) 146/78   Wt Readings from Last 3 Encounters:  03/25/21 207 lb (93.9 kg)  04/09/20 (!) 214 lb (97.1 kg)  01/16/20 213 lb 12.8 oz (  97 kg)       Physical Exam Vitals reviewed.  Constitutional:      Appearance: She is well-developed.  HENT:     Head: Normocephalic and atraumatic.  Eyes:     Conjunctiva/sclera: Conjunctivae normal.  Neck:     Thyroid: No thyromegaly.  Cardiovascular:     Rate and Rhythm: Normal rate and regular rhythm.     Heart sounds: Normal heart sounds.  Pulmonary:     Effort: Pulmonary effort is normal.     Breath sounds: Normal breath sounds.  Musculoskeletal:        General: Normal range of motion.     Cervical back: Normal range of motion.  Skin:    General: Skin is warm and dry.  Neurological:     Mental Status: She is alert and oriented to person, place, and time.  Psychiatric:        Behavior: Behavior normal.        Thought Content: Thought content normal.        Judgment: Judgment normal.      Results for orders placed or performed in visit on 03/25/21  POCT glycosylated hemoglobin (Hb A1C)  Result Value Ref Range   Hemoglobin A1C 9.6 (A) 4.0 - 5.6 %   Est. average glucose Bld gHb Est-mCnc 229     Assessment  & Plan     1. Type 2 diabetes mellitus without complication, without long-term current use of insulin (HCC) Poor control with hemoglobin A1c of 9.6.  Add Actos 15 mg every morning.  Follow-up in 3 months. - POCT glycosylated hemoglobin (Hb A1C) - Lipid panel - TSH - CBC w/Diff/Platelet - Comprehensive Metabolic Panel (CMET) - pioglitazone (ACTOS) 15 MG tablet; Take 1 tablet (15 mg total) by mouth daily.  Dispense: 30 tablet; Refill: 3  2. Pure hypercholesterolemia Goal LDL less than 70 - Lipid panel - TSH - CBC w/Diff/Platelet - Comprehensive Metabolic Panel (CMET)  3. Essential (primary) hypertension  - Lipid panel - TSH - CBC w/Diff/Platelet - Comprehensive Metabolic Panel (CMET)  4. Depression, major, recurrent, mild (HCC) On Lexapro. - Lipid panel - TSH - CBC w/Diff/Platelet - Comprehensive Metabolic Panel (CMET)   Return in about 4 months (around 07/26/2021).      I, [name], MD, have reviewed all documentation for this visit. The documentation on 03/29/21 for the exam, diagnosis, procedures, and orders are all accurate and complete.    Donovin Kraemer Wendelyn Breslow, MD  [facility] [phone] (phone) [phone] (fax)  [facility]

## 2021-03-25 NOTE — Telephone Encounter (Signed)
Pt would like to speak with Dr. Wonda Olds nurse/ she stated she went over meds and allergies but the list she has is still incorrect/ please advise

## 2021-03-25 NOTE — Telephone Encounter (Signed)
Please advise 

## 2021-03-26 ENCOUNTER — Other Ambulatory Visit: Payer: Self-pay | Admitting: Family Medicine

## 2021-03-26 DIAGNOSIS — E119 Type 2 diabetes mellitus without complications: Secondary | ICD-10-CM

## 2021-03-26 MED ORDER — LANCET DEVICE MISC: 3 refills | Status: AC

## 2021-03-26 NOTE — Telephone Encounter (Signed)
Patient was advised.  

## 2021-03-30 ENCOUNTER — Other Ambulatory Visit: Payer: Self-pay | Admitting: Family Medicine

## 2021-04-01 LAB — LIPID PANEL
Chol/HDL Ratio: 2.7 ratio (ref 0.0–4.4)
Cholesterol, Total: 190 mg/dL (ref 100–199)
HDL: 70 mg/dL (ref >39)
LDL Chol Calc (NIH): 95 mg/dL (ref 0–99)
Triglycerides: 144 mg/dL (ref 0–149)
VLDL Cholesterol Cal: 25 mg/dL (ref 5–40)

## 2021-04-01 LAB — CBC WITH DIFFERENTIAL/PLATELET
Basophils Absolute: 0.1 10*3/uL (ref 0.0–0.2)
Basos: 1 %
EOS (ABSOLUTE): 0.2 10*3/uL (ref 0.0–0.4)
Eos: 2 %
Hematocrit: 38 % (ref 34.0–46.6)
Hemoglobin: 11.9 g/dL (ref 11.1–15.9)
Immature Grans (Abs): 0.1 10*3/uL (ref 0.0–0.1)
Immature Granulocytes: 1 %
Lymphocytes Absolute: 4 10*3/uL — ABNORMAL HIGH (ref 0.7–3.1)
Lymphs: 39 %
MCH: 25.4 pg — ABNORMAL LOW (ref 26.6–33.0)
MCHC: 31.3 g/dL — ABNORMAL LOW (ref 31.5–35.7)
MCV: 81 fL (ref 79–97)
Monocytes Absolute: 0.6 10*3/uL (ref 0.1–0.9)
Monocytes: 6 %
Neutrophils Absolute: 5.4 10*3/uL (ref 1.4–7.0)
Neutrophils: 51 %
Platelets: 263 10*3/uL (ref 150–450)
RBC: 4.68 x10E6/uL (ref 3.77–5.28)
RDW: 15.7 % — ABNORMAL HIGH (ref 11.7–15.4)
WBC: 10.3 10*3/uL (ref 3.4–10.8)

## 2021-04-01 LAB — COMPREHENSIVE METABOLIC PANEL
ALT: 13 IU/L (ref 0–32)
AST: 9 IU/L (ref 0–40)
Albumin/Globulin Ratio: 2.1 (ref 1.2–2.2)
Albumin: 4.5 g/dL (ref 3.8–4.8)
Alkaline Phosphatase: 88 IU/L (ref 44–121)
BUN/Creatinine Ratio: 12 (ref 12–28)
BUN: 9 mg/dL (ref 8–27)
Bilirubin Total: 0.3 mg/dL (ref 0.0–1.2)
CO2: 26 mmol/L (ref 20–29)
Calcium: 10 mg/dL (ref 8.7–10.3)
Chloride: 97 mmol/L (ref 96–106)
Creatinine, Ser: 0.73 mg/dL (ref 0.57–1.00)
Globulin, Total: 2.1 g/dL (ref 1.5–4.5)
Glucose: 176 mg/dL — ABNORMAL HIGH (ref 65–99)
Potassium: 4.4 mmol/L (ref 3.5–5.2)
Sodium: 137 mmol/L (ref 134–144)
Total Protein: 6.6 g/dL (ref 6.0–8.5)
eGFR: 90 mL/min/{1.73_m2} (ref >59)

## 2021-04-01 LAB — TSH: TSH: 3.41 u[IU]/mL (ref 0.450–4.500)

## 2021-04-03 ENCOUNTER — Other Ambulatory Visit: Payer: Self-pay | Admitting: Family Medicine

## 2021-04-03 DIAGNOSIS — I10 Essential (primary) hypertension: Secondary | ICD-10-CM

## 2021-04-25 ENCOUNTER — Other Ambulatory Visit: Payer: Self-pay | Admitting: Family Medicine

## 2021-04-25 NOTE — Telephone Encounter (Signed)
Requested Prescriptions  Pending Prescriptions Disp Refills  . omeprazole (PRILOSEC) 40 MG capsule [Pharmacy Med Name: OMEPRAZOLE DR 40 MG CAPSULE] 30 capsule 0    Sig: TAKE 1 CAPSULE BY MOUTH EVERY DAY     Gastroenterology: Proton Pump Inhibitors Passed - 04/25/2021  8:34 AM      Passed - Valid encounter within last 12 months    Recent Outpatient Visits          1 month ago Type 2 diabetes mellitus without complication, without long-term current use of insulin [facility])   [facility] Maple Hudson., MD   1 year ago Type 2 diabetes mellitus without complication, without long-term current use of insulin [facility])   [facility] Maple Hudson., MD   1 year ago [facility] to [facility] preventive visit   [facility] Maple Hudson., MD   1 year ago Arthropathy of right temporomandibular joint   [facility] Maple Hudson., MD   1 year ago Jaw pain   [facility] Trey Sailors, [place]      Future Appointments            In 3 months Maple Hudson., MD [facility]           . Verapamil HCl CR 300 MG CP24 [Pharmacy Med Name: VERAPAMIL ER PM 300 MG CAPSULE] 30 capsule 0    Sig: TAKE 1 CAPSULE BY MOUTH ONCE DAILY     Cardiovascular:  Calcium Channel Blockers Passed - 04/25/2021  8:34 AM      Passed - Last BP in normal range    BP Readings from Last 1 Encounters:  03/25/21 135/74         Passed - Valid encounter within last 6 months    Recent Outpatient Visits          1 month ago Type 2 diabetes mellitus without complication, without long-term current use of insulin [facility])   [facility] Maple Hudson., MD   1 year ago Type 2 diabetes mellitus without complication, without long-term current use of insulin [facility])   [facility] Maple Hudson., MD   1 year ago [facility] visit   [facility] Maple Hudson., MD   1 year ago Arthropathy of right temporomandibular joint   [facility] Maple Hudson., MD   1 year ago Jaw pain   [facility] Trey Sailors, [place]      Future Appointments            In 3 months Maple Hudson., MD [facility]

## 2021-05-22 ENCOUNTER — Other Ambulatory Visit: Payer: Self-pay | Admitting: Family Medicine

## 2021-05-23 NOTE — Telephone Encounter (Signed)
Requested Prescriptions  Pending Prescriptions Disp Refills  . Verapamil HCl CR 300 MG CP24 [Pharmacy Med Name: VERAPAMIL ER PM 300 MG CAPSULE] 30 capsule 1    Sig: TAKE 1 CAPSULE BY MOUTH ONCE DAILY     Cardiovascular:  Calcium Channel Blockers Passed - 05/22/2021  1:31 PM      Passed - Last BP in normal range    BP Readings from Last 1 Encounters:  03/25/21 135/74         Passed - Valid encounter within last 6 months    Recent Outpatient Visits          1 month ago Type 2 diabetes mellitus without complication, without long-term current use of insulin [facility])   [facility] Maple Hudson., MD   1 year ago Type 2 diabetes mellitus without complication, without long-term current use of insulin [facility])   [facility] Maple Hudson., MD   1 year ago [facility] to [facility] preventive visit   [facility] Maple Hudson., MD   1 year ago Arthropathy of right temporomandibular joint   [facility] Maple Hudson., MD   1 year ago Jaw pain   [facility] Trey Sailors, [place]      Future Appointments            In 2 months Maple Hudson., MD [facility]           . omeprazole (PRILOSEC) 40 MG capsule [Pharmacy Med Name: OMEPRAZOLE DR 40 MG CAPSULE] 30 capsule 1    Sig: TAKE 1 CAPSULE BY MOUTH EVERY DAY     Gastroenterology: Proton Pump Inhibitors Passed - 05/22/2021  1:31 PM      Passed - Valid encounter within last 12 months    Recent Outpatient Visits          1 month ago Type 2 diabetes mellitus without complication, without long-term current use of insulin [facility])   [facility] Maple Hudson., MD   1 year ago Type 2 diabetes mellitus without complication, without long-term current use of insulin [facility])   [facility] Maple Hudson., MD   1 year ago [facility] to [facility] preventive visit   [facility]  Maple Hudson., MD   1 year ago Arthropathy of right temporomandibular joint   [facility] Maple Hudson., MD   1 year ago Jaw pain   [facility] Trey Sailors, [place]      Future Appointments            In 2 months Maple Hudson., MD [facility], [facility]

## 2021-06-16 ENCOUNTER — Other Ambulatory Visit: Payer: Self-pay | Admitting: Family Medicine

## 2021-06-18 ENCOUNTER — Other Ambulatory Visit: Payer: Self-pay | Admitting: Family Medicine

## 2021-06-18 DIAGNOSIS — E119 Type 2 diabetes mellitus without complications: Secondary | ICD-10-CM

## 2021-06-18 NOTE — Telephone Encounter (Signed)
Requested Prescriptions  Pending Prescriptions Disp Refills  . metFORMIN (GLUCOPHAGE) 1000 MG tablet [Pharmacy Med Name: METFORMIN HCL 1,000 MG TABLET] 180 tablet 0    Sig: TAKE 1 TABLET (1,000 MG TOTAL) BY MOUTH IN THE MORNING AND AT BEDTIME.     Endocrinology:  Diabetes - Biguanides Failed - 06/18/2021  1:38 AM      Failed - HBA1C is between 0 and 7.9 and within 180 days    Hemoglobin A1C  Date Value Ref Range Status  03/25/2021 9.6 (A) 4.0 - 5.6 % Final   Hgb A1c MFr Bld  Date Value Ref Range Status  04/25/2018 8.8 (H) 4.8 - 5.6 % Final    Comment:             Prediabetes: 5.7 - 6.4          Diabetes: >6.4          Glycemic control for adults with diabetes: <7.0          Passed - Cr in normal range and within 360 days    Creatinine, Ser  Date Value Ref Range Status  03/31/2021 0.73 0.57 - 1.00 mg/dL Final         Passed - eGFR in normal range and within 360 days    GFR calc Af Amer  Date Value Ref Range Status  11/21/2019 105 >59 mL/min/1.73 Final   GFR calc non Af Amer  Date Value Ref Range Status  11/21/2019 91 >59 mL/min/1.73 Final   eGFR  Date Value Ref Range Status  03/31/2021 90 >59 mL/min/1.73 Final         Passed - Valid encounter within last 6 months    Recent Outpatient Visits          2 months ago Type 2 diabetes mellitus without complication, without long-term current use of insulin (HCC)   [facility] Gilbert, Richard L Jr., MD   1 year ago Type 2 diabetes mellitus without complication, without long-term current use of insulin (HCC)   [facility] Gilbert, Richard L Jr., MD   1 year ago Welcome to Medicare preventive visit   [facility] Gilbert, Richard L Jr., MD   1 year ago Arthropathy of right temporomandibular joint   [facility] Gilbert, Richard L Jr., MD   1 year ago Jaw pain   [facility] Pollak, Adriana M, PA-C      Future Appointments            In 1 month  Gilbert, Richard L Jr., MD [facility], [facility]            

## 2021-06-21 ENCOUNTER — Other Ambulatory Visit: Payer: Self-pay | Admitting: Family Medicine

## 2021-06-21 DIAGNOSIS — E119 Type 2 diabetes mellitus without complications: Secondary | ICD-10-CM

## 2021-06-21 NOTE — Telephone Encounter (Signed)
Requested medication (s) are due for refill today: no  Requested medication (s) are on the active medication list: yes  Last refill:  03/25/21 #30 3 RF  Future visit scheduled: yes  Notes to clinic:  was due for recheck of Hgb A1C last one 03/25/21 9.6   Requested Prescriptions  Pending Prescriptions Disp Refills   pioglitazone (ACTOS) 15 MG tablet [Pharmacy Med Name: PIOGLITAZONE HCL 15 MG TABLET] 90 tablet 1    Sig: Take 1 tablet (15 mg total) by mouth daily.     Endocrinology:  Diabetes - Glitazones - pioglitazone Failed - 06/21/2021  9:12 AM      Failed - HBA1C is between 0 and 7.9 and within 180 days    Hemoglobin A1C  Date Value Ref Range Status  03/25/2021 9.6 (A) 4.0 - 5.6 % Final   Hgb A1c MFr Bld  Date Value Ref Range Status  04/25/2018 8.8 (H) 4.8 - 5.6 % Final    Comment:             Prediabetes: 5.7 - 6.4          Diabetes: >6.4          Glycemic control for adults with diabetes: <7.0           Passed - Valid encounter within last 6 months    Recent Outpatient Visits           2 months ago Type 2 diabetes mellitus without complication, without long-term current use of insulin [facility])   [facility] Maple Hudson., MD   1 year ago Type 2 diabetes mellitus without complication, without long-term current use of insulin [facility])   [facility] Maple Hudson., MD   1 year ago [facility] to [facility] visit   [facility] Maple Hudson., MD   1 year ago Arthropathy of right temporomandibular joint   [facility] Maple Hudson., MD   1 year ago Jaw pain   [facility] Trey Sailors, [place]       Future Appointments             In 1 month Maple Hudson., MD [facility]

## 2021-07-06 DIAGNOSIS — R9431 Abnormal electrocardiogram [ECG] [EKG]: Secondary | ICD-10-CM | POA: Insufficient documentation

## 2021-07-06 DIAGNOSIS — R0602 Shortness of breath: Secondary | ICD-10-CM | POA: Insufficient documentation

## 2021-07-09 ENCOUNTER — Other Ambulatory Visit: Payer: Self-pay | Admitting: Family Medicine

## 2021-07-13 DIAGNOSIS — R002 Palpitations: Secondary | ICD-10-CM | POA: Insufficient documentation

## 2021-07-20 ENCOUNTER — Telehealth: Payer: Self-pay

## 2021-07-20 NOTE — Telephone Encounter (Signed)
Copied from CRM [id number]. Topic: General - Inquiry >> Jul 20, 2021 11:32 AM Sarah Carson E wrote: Reason for CRM: Pt has back surgery on 11.21.22 and she had to reschedule her appt to a later date but the next opening was in February/ pt wanted to make Dr. Sullivan Lone aware / she asked if he had anything he could see her sooner than her surgery or if the appt in February will be ok/ please advise

## 2021-07-20 NOTE — Telephone Encounter (Signed)
Returned call to patient.    Appointment scheduled

## 2021-07-20 NOTE — Telephone Encounter (Signed)
How about 1` pm on Nov 10th?

## 2021-07-23 ENCOUNTER — Ambulatory Visit (INDEPENDENT_AMBULATORY_CARE_PROVIDER_SITE_OTHER): Payer: Medicare HMO | Admitting: Family Medicine

## 2021-07-23 ENCOUNTER — Other Ambulatory Visit: Payer: Self-pay

## 2021-07-23 ENCOUNTER — Encounter: Payer: Self-pay | Admitting: Family Medicine

## 2021-07-23 VITALS — BP 130/64 | HR 87 | Temp 98.5°F | Wt 207.0 lb

## 2021-07-23 DIAGNOSIS — I10 Essential (primary) hypertension: Secondary | ICD-10-CM

## 2021-07-23 DIAGNOSIS — E119 Type 2 diabetes mellitus without complications: Secondary | ICD-10-CM | POA: Diagnosis not present

## 2021-07-23 DIAGNOSIS — F330 Major depressive disorder, recurrent, mild: Secondary | ICD-10-CM

## 2021-07-23 DIAGNOSIS — F411 Generalized anxiety disorder: Secondary | ICD-10-CM

## 2021-07-23 DIAGNOSIS — E7800 Pure hypercholesterolemia, unspecified: Secondary | ICD-10-CM | POA: Diagnosis not present

## 2021-07-23 DIAGNOSIS — Z01818 Encounter for other preprocedural examination: Secondary | ICD-10-CM

## 2021-07-23 DIAGNOSIS — M5416 Radiculopathy, lumbar region: Secondary | ICD-10-CM

## 2021-07-23 LAB — POCT GLYCOSYLATED HEMOGLOBIN (HGB A1C): Hemoglobin A1C: 8.4 % — AB (ref 4.0–5.6)

## 2021-07-23 NOTE — Progress Notes (Signed)
Established patient visit   [name]   DOB: [birth year]   65 y.o. Female  MRN: [id number] Visit Date: 07/23/2021  Today's healthcare provider: Wilhemena Durie, MD   Chief Complaint  Patient presents with   Medical Clearance   Diabetes    Subjective    HPI  Patient is for surgical clearance.  Very pleasant lady that is noncompliant with follow-up comes in today for surgical clearance.  She does not really check her sugars at home but states they have not been a problem.  She is scheduled to have L4-5 laminectomy and L5-S1 microdiscectomy. She was able to walk in from the parking lot and up stairs without any discomfort or shortness of breath or chest pain.  She has no neurologic symptoms whatsoever other than her leg and back pain.   Diabetes Mellitus Type II, Follow-up  Lab Results  Component Value Date   HGBA1C 9.6 (A) 03/25/2021   HGBA1C 9.5 (A) 04/09/2020   HGBA1C 9.8 (A) 11/15/2019   Wt Readings from Last 3 Encounters:  07/23/21 207 lb (93.9 kg)  03/25/21 207 lb (93.9 kg)  04/09/20 (!) 214 lb (97.1 kg)   Last seen for diabetes 4 months ago.  Management since then includes starting pioglitazone 45m. She reports excellent compliance with treatment. She is not having side effects.  Symptoms: No fatigue No foot ulcerations  No appetite changes No nausea  No paresthesia of the feet  No polydipsia  No polyuria Yes visual disturbances   No vomiting     Home blood sugar records: fasting range: mid to high 100's  Episodes of hypoglycemia? No    Current insulin regiment: None Most Recent Eye Exam: 08/04/2020 Current exercise: walking Current diet habits: in general, a "healthy" diet    Pertinent Labs: Lab Results  Component Value Date   CHOL 190 03/31/2021   HDL 70 03/31/2021   LDLCALC 95 03/31/2021   TRIG 144 03/31/2021   CHOLHDL 2.7 03/31/2021   Lab Results  Component Value Date   NA 137 03/31/2021   K 4.4 03/31/2021   CREATININE  0.73 03/31/2021   EGFR 90 03/31/2021   MICROALBUR 50 08/28/2018   LABMICR 3.7 08/01/2017     ---------------------------------------------------------------------------------------------------     Medications: Outpatient Medications Prior to Visit  Medication Sig   glucose blood (ACCU-CHEK COMPACT STRIPS) test strip Check sugar once daily-need QS 30 day supply. DX E11.9   glucose blood (ONETOUCH VERIO) test strip Use as instructed   Lancet Device MISC One Touch Ultra Mini Lancets   metFORMIN (GLUCOPHAGE) 1000 MG tablet TAKE 1 TABLET (1,000 MG TOTAL) BY MOUTH IN THE MORNING AND AT BEDTIME.   omeprazole (PRILOSEC) 40 MG capsule TAKE 1 CAPSULE BY MOUTH EVERY DAY   pioglitazone (ACTOS) 15 MG tablet TAKE 1 TABLET (15 MG TOTAL) BY MOUTH DAILY.   tizanidine (ZANAFLEX) 2 MG capsule Take 4 mg by mouth every 8 (eight) hours as needed.   valsartan (DIOVAN) 40 MG tablet TAKE 1 TABLET BY MOUTH EVERY DAY   Verapamil HCl CR 300 MG CP24 TAKE 1 CAPSULE BY MOUTH ONCE DAILY   escitalopram (LEXAPRO) 20 MG tablet Take 1 tablet (20 mg total) by mouth daily. (Patient taking differently: Take 20 mg by mouth daily. Takes 1/2 Tablet every other day)   fluticasone (FLONASE) 50 MCG/ACT nasal spray Place 2 sprays into both nostrils daily. (Patient not taking: No sig reported)   glucose blood (ONE TOUCH ULTRA TEST) test  strip Check sugar once daily. DX E11.9   meclizine (ANTIVERT) 25 MG tablet Take 1 tablet (25 mg total) by mouth 3 (three) times daily as needed for dizziness.   sitaGLIPtin-metformin (JANUMET) 50-1000 MG tablet TAKE 1 TABLET BY MOUTH TWICE A DAY WITH FOOD (Patient not taking: Reported on 03/25/2021)   No facility-administered medications prior to visit.    Review of Systems  Constitutional:  Negative for appetite change, chills, fatigue, fever and unexpected weight change.  Respiratory:  Negative for chest tightness and shortness of breath.   Cardiovascular:  Negative for chest pain and  palpitations.  Gastrointestinal:  Negative for abdominal pain, nausea and vomiting.  Skin:  Negative for wound.  Neurological:  Negative for dizziness, weakness and numbness.      Objective    BP 130/64 (BP Location: Right Arm, Patient Position: Sitting, Cuff Size: Large)   Pulse 87   Wt 207 lb (93.9 kg)   SpO2 97%   BMI 34.45 kg/m    Physical Exam Vitals reviewed.  Constitutional:      Appearance: Normal appearance. She is well-developed.  HENT:     Head: Normocephalic and atraumatic.     Right Ear: Tympanic membrane and external ear normal.     Left Ear: Tympanic membrane and external ear normal.     Nose: Nose normal.     Mouth/Throat:     Pharynx: Oropharynx is clear.  Eyes:     General: No scleral icterus.    Conjunctiva/sclera: Conjunctivae normal.  Neck:     Thyroid: No thyromegaly.  Cardiovascular:     Rate and Rhythm: Normal rate and regular rhythm.     Heart sounds: Normal heart sounds.  Pulmonary:     Effort: Pulmonary effort is normal.     Breath sounds: Normal breath sounds.  Abdominal:     Palpations: Abdomen is soft.  Musculoskeletal:        General: Normal range of motion.     Cervical back: Normal range of motion.  Skin:    General: Skin is warm and dry.  Neurological:     General: No focal deficit present.     Mental Status: She is alert and oriented to person, place, and time.     Cranial Nerves: No cranial nerve deficit.  Psychiatric:        Mood and Affect: Mood normal.        Behavior: Behavior normal.        Thought Content: Thought content normal.        Judgment: Judgment normal.      No results found for any visits on 07/23/21.  Assessment & Plan     1. Preop examination Cleared for back surgery.  2. Type 2 diabetes mellitus without complication, without long-term current use of insulin (HCC) A1c is gone from 9.6-8.4 with no hypoglycemia.  I would like it to see it below 8.0 going forward.  She is on metformin 1000 mg twice  daily and pioglitazone 15 mg daily patient is not very compliant with follow-up in the office. - POCT glycosylated hemoglobin (Hb A1C)  3. Essential (primary) hypertension Good blood pressure control On valsartan 40 and verapamil 300 daily 4. Pure hypercholesterolemia Like to see patient on statin going forward.  5. Depression, major, recurrent, mild (HCC) On Lexapro `10 mg daily  6. Anxiety, generalized On Lexapro 10  7. Lumbar radiculopathy For surgery by Dr. Ronnald Ramp   No follow-ups on file.      I,  Richard Cranford Mon, MD, have reviewed all documentation for this visit. The documentation on 07/29/21 for the exam, diagnosis, procedures, and orders are all accurate and complete.    Richard Cranford Mon, MD  [facility] [phone] (phone) [phone] (fax)  [place]

## 2021-07-29 ENCOUNTER — Encounter: Payer: Self-pay | Admitting: Family Medicine

## 2021-07-30 NOTE — Addendum Note (Signed)
Addended by: Marlene Lard on: 07/30/2021 10:35 AM   Modules accepted: Orders

## 2021-08-07 ENCOUNTER — Other Ambulatory Visit: Payer: Self-pay | Admitting: Family Medicine

## 2021-08-08 ENCOUNTER — Other Ambulatory Visit: Payer: Self-pay | Admitting: Family Medicine

## 2021-08-12 ENCOUNTER — Ambulatory Visit: Payer: Self-pay | Admitting: Family Medicine

## 2021-08-26 DIAGNOSIS — I517 Cardiomegaly: Secondary | ICD-10-CM | POA: Insufficient documentation

## 2021-08-26 DIAGNOSIS — I358 Other nonrheumatic aortic valve disorders: Secondary | ICD-10-CM | POA: Insufficient documentation

## 2021-09-12 ENCOUNTER — Other Ambulatory Visit: Payer: Self-pay | Admitting: Family Medicine

## 2021-09-12 DIAGNOSIS — I10 Essential (primary) hypertension: Secondary | ICD-10-CM

## 2021-09-12 NOTE — Telephone Encounter (Signed)
Requested Prescriptions  Pending Prescriptions Disp Refills   valsartan (DIOVAN) 40 MG tablet [Pharmacy Med Name: VALSARTAN 40 MG TABLET] 90 tablet 0    Sig: TAKE 1 TABLET BY MOUTH EVERY DAY     Cardiovascular:  Angiotensin Receptor Blockers Passed - 09/12/2021  9:18 AM      Passed - Cr in normal range and within 180 days    Creatinine, Ser  Date Value Ref Range Status  03/31/2021 0.73 0.57 - 1.00 mg/dL Final         Passed - K in normal range and within 180 days    Potassium  Date Value Ref Range Status  03/31/2021 4.4 3.5 - 5.2 mmol/L Final         Passed - Patient is not pregnant      Passed - Last BP in normal range    BP Readings from Last 1 Encounters:  07/23/21 130/64         Passed - Valid encounter within last 6 months    Recent Outpatient Visits          1 month ago Preop examination   [facility] Maple Hudson., MD   5 months ago Type 2 diabetes mellitus without complication, without long-term current use of insulin [facility])   [facility] Maple Hudson., MD   1 year ago Type 2 diabetes mellitus without complication, without long-term current use of insulin [facility])   [facility] Maple Hudson., MD   1 year ago [facility] preventive visit   [facility] Maple Hudson., MD   1 year ago Arthropathy of right temporomandibular joint   [facility] Maple Hudson., MD      Future Appointments            In 2 months Maple Hudson., MD [facility]

## 2021-09-14 ENCOUNTER — Other Ambulatory Visit: Payer: Self-pay | Admitting: Family Medicine

## 2021-09-14 DIAGNOSIS — E119 Type 2 diabetes mellitus without complications: Secondary | ICD-10-CM

## 2021-09-29 ENCOUNTER — Telehealth: Payer: Self-pay

## 2021-09-29 NOTE — Telephone Encounter (Signed)
[facility] faxed refill request for the following medications:  meclizine (ANTIVERT) 25 MG tablet   Please advise.  

## 2021-09-30 NOTE — Telephone Encounter (Signed)
Lov 07/23/21. Last prescribed 01/15/2019, d/c by Marcelino DusterMichelle on 07/30/21. KW

## 2021-10-01 NOTE — Telephone Encounter (Signed)
Patient has been advised she has declined visit stating that she is told this every time and does not wish to come in. Tally Joe was notified. KW

## 2021-10-12 ENCOUNTER — Telehealth: Payer: Self-pay | Admitting: Family Medicine

## 2021-10-12 NOTE — Telephone Encounter (Signed)
Copied from CRM [phone]#[id number]. Topic: Medicare AWV >> Oct 12, 2021  1:52 PM Claudette LawsSmith, Stephanie R wrote: Reason for CRM:  Left message for patient to call back and schedule Medicare Annual Wellness Visit (AWV) in office.   If not able to come in office, please offer to do virtually or by telephone.   Last AWV: 01/16/2020  Please schedule at anytime with [facility].  If any questions, please contact me at [phone]

## 2021-10-19 ENCOUNTER — Ambulatory Visit: Payer: Medicare HMO | Admitting: Family Medicine

## 2021-10-27 ENCOUNTER — Ambulatory Visit (INDEPENDENT_AMBULATORY_CARE_PROVIDER_SITE_OTHER): Payer: Medicare HMO

## 2021-10-27 DIAGNOSIS — Z0000 Encounter for general adult medical examination without abnormal findings: Secondary | ICD-10-CM | POA: Diagnosis not present

## 2021-10-27 DIAGNOSIS — M48061 Spinal stenosis, lumbar region without neurogenic claudication: Secondary | ICD-10-CM | POA: Insufficient documentation

## 2021-10-27 NOTE — Patient Instructions (Signed)
Sarah Carson , Thank you for taking time to come for your Medicare Wellness Visit. I appreciate your ongoing commitment to your health goals. Please review the following plan we discussed and let me know if I can assist you in the future.   Screening recommendations/referrals: Colonoscopy: 12/06/12, due 2024 Mammogram: 11/21/19, declined referral Bone Density: declined referral Recommended yearly ophthalmology/optometry visit for glaucoma screening and checkup Recommended yearly dental visit for hygiene and checkup  Vaccinations: Influenza vaccine: n/d Pneumococcal vaccine: n/d Tdap vaccine: 03/21/07, due Shingles vaccine: Zostavax 07/22/11   Covid-19:n/d  Advanced directives: no  Conditions/risks identified: none  Next appointment: Follow up in one year for your annual wellness visit - declined appointment   Preventive Care 65 Years and Older, Female Preventive care refers to lifestyle choices and visits with your health care provider that can promote health and wellness. What does preventive care include? A yearly physical exam. This is also called an annual well check. Dental exams once or twice a year. Routine eye exams. Ask your health care provider how often you should have your eyes checked. Personal lifestyle choices, including: Daily care of your teeth and gums. Regular physical activity. Eating a healthy diet. Avoiding tobacco and drug use. Limiting alcohol use. Practicing safe sex. Taking low-dose aspirin every day. Taking vitamin and mineral supplements as recommended by your health care provider. What happens during an annual well check? The services and screenings done by your health care provider during your annual well check will depend on your age, overall health, lifestyle risk factors, and family history of disease. Counseling  Your health care provider may ask you questions about your: Alcohol use. Tobacco use. Drug use. Emotional well-being. Home and  relationship well-being. Sexual activity. Eating habits. History of falls. Memory and ability to understand (cognition). Work and work Astronomer. Reproductive health. Screening  You may have the following tests or measurements: Height, weight, and BMI. Blood pressure. Lipid and cholesterol levels. These may be checked every 5 years, or more frequently if you are over 56 years old. Skin check. Lung cancer screening. You may have this screening every year starting at age 47 if you have a 30-pack-year history of smoking and currently smoke or have quit within the past 15 years. Fecal occult blood test (FOBT) of the stool. You may have this test every year starting at age 76. Flexible sigmoidoscopy or colonoscopy. You may have a sigmoidoscopy every 5 years or a colonoscopy every 10 years starting at age 13. Hepatitis C blood test. Hepatitis B blood test. Sexually transmitted disease (STD) testing. Diabetes screening. This is done by checking your blood sugar (glucose) after you have not eaten for a while (fasting). You may have this done every 1-3 years. Bone density scan. This is done to screen for osteoporosis. You may have this done starting at age 35. Mammogram. This may be done every 1-2 years. Talk to your health care provider about how often you should have regular mammograms. Talk with your health care provider about your test results, treatment options, and if necessary, the need for more tests. Vaccines  Your health care provider may recommend certain vaccines, such as: Influenza vaccine. This is recommended every year. Tetanus, diphtheria, and acellular pertussis (Tdap, Td) vaccine. You may need a Td booster every 10 years. Zoster vaccine. You may need this after age 88. Pneumococcal 13-valent conjugate (PCV13) vaccine. One dose is recommended after age 82. Pneumococcal polysaccharide (PPSV23) vaccine. One dose is recommended after age 72. Talk to your  health care provider  about which screenings and vaccines you need and how often you need them. This information is not intended to replace advice given to you by your health care provider. Make sure you discuss any questions you have with your health care provider. Document Released: 09/26/2015 Document Revised: 05/19/2016 Document Reviewed: 07/01/2015 Elsevier Interactive Patient Education  2017 [place] Prevention in the Home Falls can cause injuries. They can happen to people of all ages. There are many things you can do to make your home safe and to help prevent falls. What can I do on the outside of my home? Regularly fix the edges of walkways and driveways and fix any cracks. Remove anything that might make you trip as you walk through a door, such as a raised step or threshold. Trim any bushes or trees on the path to your home. Use bright outdoor lighting. Clear any walking paths of anything that might make someone trip, such as rocks or tools. Regularly check to see if handrails are loose or broken. Make sure that both sides of any steps have handrails. Any raised decks and porches should have guardrails on the edges. Have any leaves, snow, or ice cleared regularly. Use sand or salt on walking paths during winter. Clean up any spills in your garage right away. This includes oil or grease spills. What can I do in the bathroom? Use night lights. Install grab bars by the toilet and in the tub and shower. Do not use towel bars as grab bars. Use non-skid mats or decals in the tub or shower. If you need to sit down in the shower, use a plastic, non-slip stool. Keep the floor dry. Clean up any water that spills on the floor as soon as it happens. Remove soap buildup in the tub or shower regularly. Attach bath mats securely with double-sided non-slip rug tape. Do not have throw rugs and other things on the floor that can make you trip. What can I do in the bedroom? Use night lights. Make sure  that you have a light by your bed that is easy to reach. Do not use any sheets or blankets that are too big for your bed. They should not hang down onto the floor. Have a firm chair that has side arms. You can use this for support while you get dressed. Do not have throw rugs and other things on the floor that can make you trip. What can I do in the kitchen? Clean up any spills right away. Avoid walking on wet floors. Keep items that you use a lot in easy-to-reach places. If you need to reach something above you, use a strong step stool that has a grab bar. Keep electrical cords out of the way. Do not use floor polish or wax that makes floors slippery. If you must use wax, use non-skid floor wax. Do not have throw rugs and other things on the floor that can make you trip. What can I do with my stairs? Do not leave any items on the stairs. Make sure that there are handrails on both sides of the stairs and use them. Fix handrails that are broken or loose. Make sure that handrails are as long as the stairways. Check any carpeting to make sure that it is firmly attached to the stairs. Fix any carpet that is loose or worn. Avoid having throw rugs at the top or bottom of the stairs. If you do have throw rugs, attach them  to the floor with carpet tape. Make sure that you have a light switch at the top of the stairs and the bottom of the stairs. If you do not have them, ask someone to add them for you. What else can I do to help prevent falls? Wear shoes that: Do not have high heels. Have rubber bottoms. Are comfortable and fit you well. Are closed at the toe. Do not wear sandals. If you use a stepladder: Make sure that it is fully opened. Do not climb a closed stepladder. Make sure that both sides of the stepladder are locked into place. Ask someone to hold it for you, if possible. Clearly mark and make sure that you can see: Any grab bars or handrails. First and last steps. Where the edge of  each step is. Use tools that help you move around (mobility aids) if they are needed. These include: Canes. Walkers. Scooters. Crutches. Turn on the lights when you go into a dark area. Replace any light bulbs as soon as they burn out. Set up your furniture so you have a clear path. Avoid moving your furniture around. If any of your floors are uneven, fix them. If there are any pets around you, be aware of where they are. Review your medicines with your doctor. Some medicines can make you feel dizzy. This can increase your chance of falling. Ask your doctor what other things that you can do to help prevent falls. This information is not intended to replace advice given to you by your health care provider. Make sure you discuss any questions you have with your health care provider. Document Released: 06/26/2009 Document Revised: 02/05/2016 Document Reviewed: 10/04/2014 Elsevier Interactive Patient Education  2017 [employer].

## 2021-10-27 NOTE — Progress Notes (Signed)
Virtual Visit via Telephone Note  I connected with  Sarah Carson on 10/27/21 at  8:20 AM EST by telephone and verified that I am speaking with the correct person using two identifiers.  Location: Patient: home Provider: [facility] Persons participating in the virtual visit: patient/Nurse Health Advisor   I discussed the limitations, risks, security and privacy concerns of performing an evaluation and management service by telephone and the availability of in person appointments. The patient expressed understanding and agreed to proceed.  Interactive audio and video telecommunications were attempted between this nurse and patient, however failed, due to patient having technical difficulties OR patient did not have access to video capability.  We continued and completed visit with audio only.  Some vital signs may be absent or patient reported.   Hal Hope, LPN  Subjective:   Sarah Carson is a 66 y.o. female who presents for Medicare Annual (Subsequent) preventive examination.  Review of Systems           Objective:    There were no vitals filed for this visit. There is no height or weight on file to calculate BMI.  Advanced Directives 04/23/2015  Does Patient Have a Medical Advance Directive? Yes    Current Medications (verified) Outpatient Encounter Medications as of 10/27/2021  Medication Sig   glucose blood (ACCU-CHEK COMPACT STRIPS) test strip Check sugar once daily-need QS 30 day supply. DX E11.9   glucose blood (ONETOUCH VERIO) test strip Use as instructed   Lancet Device MISC One Touch Ultra Mini Lancets   metFORMIN (GLUCOPHAGE) 1000 MG tablet TAKE 1 TABLET (1,000 MG TOTAL) BY MOUTH IN THE MORNING AND AT BEDTIME.   omeprazole (PRILOSEC) 40 MG capsule TAKE 1 CAPSULE BY MOUTH EVERY DAY   pioglitazone (ACTOS) 15 MG tablet TAKE 1 TABLET (15 MG TOTAL) BY MOUTH DAILY.   tizanidine (ZANAFLEX) 2 MG capsule Take 4 mg by mouth every 8 (eight) hours as needed.   valsartan  (DIOVAN) 40 MG tablet TAKE 1 TABLET BY MOUTH EVERY DAY   Verapamil HCl CR 300 MG CP24 TAKE 1 CAPSULE BY MOUTH ONCE DAILY   No facility-administered encounter medications on file as of 10/27/2021.    Allergies (verified) Meloxicam, Darvon [propoxyphene], Ketorolac, Liraglutide, Toradol [ketorolac tromethamine], Vioxx [rofecoxib], Xopenex [levalbuterol], Hydrocodone-acetaminophen, and Hydrocodone-acetaminophen   History: Past Medical History:  Diagnosis Date   Diabetes (HCC)    Hypertension    Past Surgical History:  Procedure Laterality Date   ABDOMINAL HYSTERECTOMY     BREAST BIOPSY Right 2013   benign, with marker   BREAST EXCISIONAL BIOPSY Bilateral yrs ago   benign   BREAST SURGERY     CHOLECYSTECTOMY     KNEE ARTHROSCOPY     RIGHT OOPHORECTOMY     TONSILLECTOMY     Family History  Problem Relation Age of Onset   Hypertension Mother    Heart disease Mother    Diabetes Mother    Arthritis Mother    Alcohol abuse Father    Heart disease Father        died of MI age 55   Neuromuscular disorder Sister    Neuromuscular disorder Sister    Diabetes Maternal Grandmother    Arthritis Maternal Grandfather        rheumatoid   Cancer Paternal Grandfather    Social History   Socioeconomic History   Marital status: Widowed    Spouse name: [name]   Number of children: 2   Years of education: college  Highest education level: Not on file  Occupational History   Occupation: Print production planner  Tobacco Use   Smoking status: Never   Smokeless tobacco: Never  Vaping Use   Vaping Use: Never used  Substance and Sexual Activity   Alcohol use: No    Alcohol/week: 0.0 standard drinks   Drug use: No   Sexual activity: Not Currently  Other Topics Concern   Not on file  Social History Narrative   Not on file   Social Determinants of Health   Financial Resource Strain: Not on file  Food Insecurity: Not on file  Transportation Needs: Not on file  Physical Activity: Not on  file  Stress: Not on file  Social Connections: Not on file    Tobacco Counseling Counseling given: Not Answered   Clinical Intake:  Pre-visit preparation completed: Yes  Pain : No/denies pain     Nutritional Risks: None Diabetes: Yes CBG done?: No Did pt. bring in CBG monitor from home?: No  How often do you need to have someone help you when you read instructions, pamphlets, or other written materials from your doctor or pharmacy?: 1 - Never  Diabetic?yes Nutrition Risk Assessment:  Has the patient had any N/V/D within the last 2 months?  No  Does the patient have any non-healing wounds?  No  Has the patient had any unintentional weight loss or weight gain?  No   Diabetes:  Is the patient diabetic?  Yes  If diabetic, was a CBG obtained today?  No  Did the patient bring in their glucometer from home?  No  How often do you monitor your CBG's? Once per week.   Financial Strains and Diabetes Management:  Are you having any financial strains with the device, your supplies or your medication? No .  Does the patient want to be seen by Chronic Care Management for management of their diabetes?  No  Would the patient like to be referred to a Nutritionist or for Diabetic Management?  No   Diabetic Exams:  Diabetic Eye Exam: Completed 08/04/20. Overdue for diabetic eye exam. Pt has been advised about the importance in completing this exam.   Diabetic Foot Exam: Completed no. Pt has been advised about the importance in completing this exam.   Interpreter Needed?: No  Information entered by :: Kennedy Bucker, LPN   Activities of Daily Living In your present state of health, do you have any difficulty performing the following activities: 07/23/2021  Hearing? Y  Vision? Y  Difficulty concentrating or making decisions? N  Walking or climbing stairs? Y  Dressing or bathing? N  Doing errands, shopping? N  Some recent data might be hidden    Patient Care Team: Maple Hudson., MD as PCP - General (Family Medicine)  Indicate any recent Medical Services you may have received from other than [facility] providers in the past year (date may be approximate).     Assessment:   This is a routine wellness examination for Sarah Carson.  Hearing/Vision screen No results found.  Dietary issues and exercise activities discussed:     Goals Addressed   None    Depression Screen PHQ 2/9 Scores 07/23/2021 01/16/2020 10/31/2018 07/27/2017 04/01/2016  PHQ - 2 Score 0 0 0 0 1  PHQ- 9 Score 0 0 - 0 2    Fall Risk Fall Risk  07/23/2021 01/16/2020 11/15/2019 10/31/2018 07/27/2017  Falls in the past year? 0 0 0 0 Yes  Number falls in past yr: 0  0 0 - 1  Injury with Fall? 0 0 0 - No  Risk for fall due to : No Fall Risks - - - -  Follow up Falls evaluation completed Falls evaluation completed Falls evaluation completed - -    FALL RISK PREVENTION PERTAINING TO THE HOME:  Any stairs in or around the home? Yes  If so, are there any without handrails? No  Home free of loose throw rugs in walkways, pet beds, electrical cords, etc? Yes  Adequate lighting in your home to reduce risk of falls? Yes   ASSISTIVE DEVICES UTILIZED TO PREVENT FALLS:  Life alert? No  Use of a cane, walker or w/c? Yes  Grab bars in the bathroom? Yes  Shower chair or bench in shower? No  Elevated toilet seat or a handicapped toilet? Yes    Cognitive Function:Normal cognitive status assessed by direct observation by this Nurse Health Advisor. No abnormalities found.          Immunizations Immunization History  Administered Date(s) Administered   PPD Test 04/25/2018   Tdap 03/21/2007   Zoster, Live 07/22/2011    TDAP status: Due, Education has been provided regarding the importance of this vaccine. Advised may receive this vaccine at local pharmacy or Health Dept. Aware to provide a copy of the vaccination record if obtained from local pharmacy or Health Dept. Verbalized acceptance and  understanding.  Flu Vaccine status: Declined, Education has been provided regarding the importance of this vaccine but patient still declined. Advised may receive this vaccine at local pharmacy or Health Dept. Aware to provide a copy of the vaccination record if obtained from local pharmacy or Health Dept. Verbalized acceptance and understanding.  Pneumococcal vaccine status: Declined,  Education has been provided regarding the importance of this vaccine but patient still declined. Advised may receive this vaccine at local pharmacy or Health Dept. Aware to provide a copy of the vaccination record if obtained from local pharmacy or Health Dept. Verbalized acceptance and understanding.   Covid-19 vaccine status: Declined, Education has been provided regarding the importance of this vaccine but patient still declined. Advised may receive this vaccine at local pharmacy or Health Dept.or vaccine clinic. Aware to provide a copy of the vaccination record if obtained from local pharmacy or Health Dept. Verbalized acceptance and understanding.  Qualifies for Shingles Vaccine? Yes   Zostavax completed Yes   Shingrix Completed?: No.    Education has been provided regarding the importance of this vaccine. Patient has been advised to call insurance company to determine out of pocket expense if they have not yet received this vaccine. Advised may also receive vaccine at local pharmacy or Health Dept. Verbalized acceptance and understanding.  Screening Tests Health Maintenance  Topic Date Due   COVID-19 Vaccine (1) Never done   FOOT EXAM  Never done   Zoster Vaccines- Shingrix (1 of 2) Never done   TETANUS/TDAP  03/20/2017   Pneumonia Vaccine 74+ Years old (1 - PCV) Never done   DEXA SCAN  Never done   OPHTHALMOLOGY EXAM  08/04/2021   INFLUENZA VACCINE  12/11/2021 (Originally 04/13/2021)   MAMMOGRAM  11/20/2021   HEMOGLOBIN A1C  01/20/2022   COLONOSCOPY (Pts 45-61yrs Insurance coverage will need to be  confirmed)  12/07/2022   Hepatitis C Screening  Completed   HPV VACCINES  Aged Out    Health Maintenance  Health Maintenance Due  Topic Date Due   COVID-19 Vaccine (1) Never done   FOOT EXAM  Never done  Zoster Vaccines- Shingrix (1 of 2) Never done   TETANUS/TDAP  03/20/2017   Pneumonia Vaccine 6565+ Years old (1 - PCV) Never done   DEXA SCAN  Never done   OPHTHALMOLOGY EXAM  08/04/2021    Colorectal cancer screening: Type of screening: Colonoscopy. Completed 12/06/12. Repeat every 10 years  Mammogram status: Completed 11/21/19. Repeat every year- declined referral   Lung Cancer Screening: (Low Dose CT Chest recommended if Age 23-80 years, 30 pack-year currently smoking OR have quit w/in 15years.) does not qualify.    Additional Screening:  Hepatitis C Screening: does qualify; Completed 04/01/17  Vision Screening: Recommended annual ophthalmology exams for early detection of glaucoma and other disorders of the eye. Is the patient up to date with their annual eye exam?  Yes  Who is the provider or what is the name of the office in which the patient attends annual eye exams? [facility] If pt is not established with a provider, would they like to be referred to a provider to establish care? No .   Dental Screening: Recommended annual dental exams for proper oral hygiene  Community Resource Referral / Chronic Care Management: CRR required this visit?  No   CCM required this visit?  No      Plan:     I have personally reviewed and noted the following in the patient's chart:   Medical and social history Use of alcohol, tobacco or illicit drugs  Current medications and supplements including opioid prescriptions.  Functional ability and status Nutritional status Physical activity Advanced directives List of other physicians Hospitalizations, surgeries, and ER visits in previous 12 months Vitals Screenings to include cognitive, depression, and falls Referrals  and appointments  In addition, I have reviewed and discussed with patient certain preventive protocols, quality metrics, and best practice recommendations. A written personalized care plan for preventive services as well as general preventive health recommendations were provided to patient.     Hal HopeLorrie S Chyenne Sobczak, LPN   1/61/09602/14/2023   Nurse Notes:

## 2021-11-10 ENCOUNTER — Other Ambulatory Visit: Payer: Self-pay | Admitting: Neurological Surgery

## 2021-11-10 DIAGNOSIS — M5416 Radiculopathy, lumbar region: Secondary | ICD-10-CM

## 2021-11-19 ENCOUNTER — Other Ambulatory Visit: Payer: Self-pay

## 2021-11-19 ENCOUNTER — Ambulatory Visit
Admission: RE | Admit: 2021-11-19 | Discharge: 2021-11-19 | Disposition: A | Discharge status: Home / Self Care | Payer: Medicare HMO | Source: Ambulatory Visit | Attending: Neurological Surgery | Admitting: Neurological Surgery

## 2021-11-19 DIAGNOSIS — M5416 Radiculopathy, lumbar region: Secondary | ICD-10-CM | POA: Insufficient documentation

## 2021-11-19 MED ORDER — GADOBUTROL 1 MMOL/ML IV SOLN
10.0000 mL | Freq: Once | INTRAVENOUS | Status: AC | PRN
  Administered 2021-11-19: 11:00:00 10 mL via INTRAVENOUS

## 2021-11-20 NOTE — Progress Notes (Unsigned)
Established patient visit   [name]   DOB: 05/24/1956   66 y.o. Female  MRN: [id number] Visit Date: 11/23/2021  Today's healthcare provider: Wilhemena Durie, MD   No chief complaint on file.  Subjective    HPI  Diabetes Mellitus Type II, follow-up  Lab Results  Component Value Date   HGBA1C 8.4 (A) 07/23/2021   HGBA1C 9.6 (A) 03/25/2021   HGBA1C 9.5 (A) 04/09/2020   Last seen for diabetes 4 months ago.  Management since then includes continuing the same treatment. She reports good compliance with treatment. She is not having side effects.  Home blood sugar records: Pt reports she takes occasionally was average readings in the 190's.   Episodes of hypoglycemia? No    Current insulin regiment: {***None                               Most Recent Eye Exam: yes Dec. '22   --------------------------------------------------------------------------------------------------- Hypertension, follow-up  BP Readings from Last 3 Encounters:  07/23/21 130/64  03/25/21 135/74  04/09/20 (!) 131/74   Wt Readings from Last 3 Encounters:  07/23/21 207 lb (93.9 kg)  03/25/21 207 lb (93.9 kg)  04/09/20 (!) 214 lb (97.1 kg)     She was last seen for hypertension 4 months ago.  BP at that visit was 130/64. Management since that visit includes; Good blood pressure control. On valsartan 40 and verapamil 300 daily. She reports good compliance with treatment. She is not having side effects. She is not exercising. She is not adherent to low salt diet.   Outside blood pressures are: Pt reports she takes occasionally with average 140/76.   She does not smoke.  Use of agents associated with hypertension: none   --------------------------------------------------------------------------------------------------- Lipid/Cholesterol, follow-up  Last Lipid Panel: Lab Results  Component Value Date   CHOL 190 03/31/2021   LDLCALC 95 03/31/2021   HDL 70 03/31/2021    TRIG 144 03/31/2021    She was last seen for this 4 months ago.  Management since that visit includes; Like to see patient on statin going forward.  She reports good compliance with treatment. She is not having side effects.none   She is following a Regular diet. Current exercise: none   Last metabolic panel Lab Results  Component Value Date   GLUCOSE 176 (H) 03/31/2021   NA 137 03/31/2021   K 4.4 03/31/2021   BUN 9 03/31/2021   CREATININE 0.73 03/31/2021   EGFR 90 03/31/2021   GFRNONAA 91 11/21/2019   CALCIUM 10.0 03/31/2021   AST 9 03/31/2021   ALT 13 03/31/2021   The 10-year ASCVD risk score (Arnett DK, et al., 2019) is: 23.5%  --------------------------------------------------------------------------------------------------- Depression, Follow-up  She  was last seen for this 4 months ago. Changes made at last visit include; on Lexapro.   She reports good compliance with treatment. She reports she is currently not taking medication for depression and doing well.     Current symptoms include: none "doing fine" She feels she is Improved since last visit.  Depression screen [facility] 2/9 10/27/2021 07/23/2021 01/16/2020  Decreased Interest 0 0 0  Down, Depressed, Hopeless 0 0 0  PHQ - 2 Score 0 0 0  Altered sleeping - 0 0  Tired, decreased energy - 0 0  Change in appetite - 0 0  Feeling bad or failure about yourself  - 0 0  Trouble concentrating -  0 0  Moving slowly or fidgety/restless - 0 0  Suicidal thoughts - 0 0  PHQ-9 Score - 0 0  Difficult doing work/chores - Not difficult at all Not difficult at all    ----------------------------------------------------------------------------------------- Anxiety, Follow-up  She was last seen for anxiety 4 months ago. Changes made at last visit include; on Lexapro. Pt reports she is not taking and feeling fine.    She feels her anxiety is resolved and Improved since last visit.    GAD-7 Results No flowsheet data  found.  PHQ-9 Scores PHQ9 SCORE ONLY 10/27/2021 07/23/2021 01/16/2020  PHQ-9 Total Score 0 0 0    ---------------------------------------------------------------------------------------------------   Medications: Outpatient Medications Prior to Visit  Medication Sig   diazepam (VALIUM) 5 MG tablet SMARTSIG:1 Tablet(s) By Mouth   glucose blood (ACCU-CHEK COMPACT STRIPS) test strip Check sugar once daily-need QS 30 day supply. DX E11.9   glucose blood (ONETOUCH VERIO) test strip Use as instructed   Lancet Device MISC One Touch Ultra Mini Lancets   Lancets (ONETOUCH DELICA PLUS [id number]) MISC See admin instructions.   metFORMIN (GLUCOPHAGE) 1000 MG tablet TAKE 1 TABLET (1,000 MG TOTAL) BY MOUTH IN THE MORNING AND AT BEDTIME.   metFORMIN (GLUCOPHAGE) 500 MG tablet Take by mouth.   methocarbamol (ROBAXIN) 500 MG tablet Take by mouth.   omeprazole (PRILOSEC) 40 MG capsule TAKE 1 CAPSULE BY MOUTH EVERY DAY   Oxycodone HCl 10 MG TABS TAKE 1 TABLET BY MOUTH THREE TIMES A DAY AS NEEDED FOR PAIN   oxycodone-acetaminophen (PERCOCET) 2.5-325 MG tablet    oxyCODONE-acetaminophen (PERCOCET) 5-325 MG tablet Take by mouth.   oxyCODONE-acetaminophen (PERCOCET/ROXICET) 5-325 MG tablet    pioglitazone (ACTOS) 15 MG tablet TAKE 1 TABLET (15 MG TOTAL) BY MOUTH DAILY.   pregabalin (LYRICA) 25 MG capsule    tizanidine (ZANAFLEX) 2 MG capsule Take 4 mg by mouth every 8 (eight) hours as needed. (Patient not taking: Reported on 10/27/2021)   valsartan (DIOVAN) 40 MG tablet TAKE 1 TABLET BY MOUTH EVERY DAY   Verapamil HCl CR 300 MG CP24 TAKE 1 CAPSULE BY MOUTH ONCE DAILY   No facility-administered medications prior to visit.    Review of Systems  Constitutional:  Negative for appetite change, chills, fatigue and fever.  Respiratory:  Negative for chest tightness and shortness of breath.   Cardiovascular:  Negative for chest pain and palpitations.  Gastrointestinal:  Negative for abdominal  pain, nausea and vomiting.  Neurological:  Negative for dizziness and weakness.   {Labs   Heme   Chem   Endocrine   Serology   Results Review (optional):[id number]}   Objective    There were no vitals taken for this visit. {Show previous vital signs (optional):23777}  Physical Exam  ***  No results found for any visits on 11/23/21.  Assessment & Plan     ***  No follow-ups on file.      {provider attestation***:1}   Wilhemena Durie, MD  [facility] [phone] (phone) [phone] (fax)  [place]

## 2021-11-23 ENCOUNTER — Ambulatory Visit (INDEPENDENT_AMBULATORY_CARE_PROVIDER_SITE_OTHER): Payer: Medicare HMO | Admitting: Family Medicine

## 2021-11-23 ENCOUNTER — Other Ambulatory Visit: Payer: Self-pay

## 2021-11-23 VITALS — BP 125/79 | HR 89 | Temp 98.4°F | Resp 16 | Wt 211.0 lb

## 2021-11-23 DIAGNOSIS — I10 Essential (primary) hypertension: Secondary | ICD-10-CM

## 2021-11-23 DIAGNOSIS — F330 Major depressive disorder, recurrent, mild: Secondary | ICD-10-CM | POA: Diagnosis not present

## 2021-11-23 DIAGNOSIS — M5416 Radiculopathy, lumbar region: Secondary | ICD-10-CM

## 2021-11-23 DIAGNOSIS — E7800 Pure hypercholesterolemia, unspecified: Secondary | ICD-10-CM

## 2021-11-23 DIAGNOSIS — F411 Generalized anxiety disorder: Secondary | ICD-10-CM

## 2021-11-23 DIAGNOSIS — E119 Type 2 diabetes mellitus without complications: Secondary | ICD-10-CM

## 2021-11-23 DIAGNOSIS — M5412 Radiculopathy, cervical region: Secondary | ICD-10-CM

## 2021-11-23 LAB — POCT GLYCOSYLATED HEMOGLOBIN (HGB A1C): Hemoglobin A1C: 8.5 % — AB (ref 4.0–5.6)

## 2021-11-23 MED ORDER — PIOGLITAZONE HCL 30 MG PO TABS: 30.0000 mg | ORAL_TABLET | Freq: Every day | ORAL | 1 refills | Status: DC

## 2021-12-09 ENCOUNTER — Other Ambulatory Visit: Payer: Self-pay | Admitting: Family Medicine

## 2021-12-09 DIAGNOSIS — I10 Essential (primary) hypertension: Secondary | ICD-10-CM

## 2021-12-12 ENCOUNTER — Other Ambulatory Visit: Payer: Self-pay | Admitting: Family Medicine

## 2021-12-12 DIAGNOSIS — E119 Type 2 diabetes mellitus without complications: Secondary | ICD-10-CM

## 2021-12-14 NOTE — Telephone Encounter (Signed)
Requested Prescriptions  ?Pending Prescriptions Disp Refills  ?? metFORMIN (GLUCOPHAGE) 1000 MG tablet [Pharmacy Med Name: METFORMIN HCL 1,000 MG TABLET] 180 tablet 0  ?  Sig: TAKE 1 TABLET (1,000 MG TOTAL) BY MOUTH IN THE MORNING AND AT BEDTIME  ?  ? Endocrinology:  Diabetes - Biguanides Failed - 12/12/2021  9:04 AM  ?  ?  Failed - HBA1C is between 0 and 7.9 and within 180 days  ?  Hemoglobin A1C  ?Date Value Ref Range Status  ?11/23/2021 8.5 (A) 4.0 - 5.6 % Final  ? ?Hgb A1c MFr Bld  ?Date Value Ref Range Status  ?04/25/2018 8.8 (H) 4.8 - 5.6 % Final  ?  Comment:  ?           Prediabetes: 5.7 - 6.4 ?         Diabetes: >6.4 ?         Glycemic control for adults with diabetes: <7.0 ?  ?   ?  ?  Failed - B12 Level in normal range and within 720 days  ?  No results found for: VITAMINB12   ?  ?  Passed - Cr in normal range and within 360 days  ?  Creatinine, Ser  ?Date Value Ref Range Status  ?03/31/2021 0.73 0.57 - 1.00 mg/dL Final  ?   ?  ?  Passed - eGFR in normal range and within 360 days  ?  GFR calc Af Amer  ?Date Value Ref Range Status  ?11/21/2019 105 >59 mL/min/1.73 Final  ? ?GFR calc non Af Amer  ?Date Value Ref Range Status  ?11/21/2019 91 >59 mL/min/1.73 Final  ? ?eGFR  ?Date Value Ref Range Status  ?03/31/2021 90 >59 mL/min/1.73 Final  ?   ?  ?  Passed - Valid encounter within last 6 months  ?  Recent Outpatient Visits   ?      ? 3 weeks ago Type 2 diabetes mellitus without complication, without long-term current use of insulin ([place])  ? [facility] Jerrol Banana., MD  ? 4 months ago Preop examination  ? [facility] Jerrol Banana., MD  ? 8 months ago Type 2 diabetes mellitus without complication, without long-term current use of insulin ([place])  ? [facility] Jerrol Banana., MD  ? 1 year ago Type 2 diabetes mellitus without complication, without long-term current use of insulin ([place])  ? [facility] Jerrol Banana., MD  ?  1 year ago [facility] to [facility] preventive visit  ? [facility] Jerrol Banana., MD  ?  ?  ?Future Appointments   ?        ? In 3 months Jerrol Banana., MD [facility]  ?  ? ?  ?  ?  Passed - CBC within normal limits and completed in the last 12 months  ?  WBC  ?Date Value Ref Range Status  ?03/31/2021 10.3 3.4 - 10.8 x10E3/uL Final  ? ?RBC  ?Date Value Ref Range Status  ?03/31/2021 4.68 3.77 - 5.28 x10E6/uL Final  ? ?Hemoglobin  ?Date Value Ref Range Status  ?03/31/2021 11.9 11.1 - 15.9 g/dL Final  ? ?Hematocrit  ?Date Value Ref Range Status  ?03/31/2021 38.0 34.0 - 46.6 % Final  ? ?MCHC  ?Date Value Ref Range Status  ?03/31/2021 31.3 (L) 31.5 - 35.7 g/dL Final  ? ?MCH  ?Date Value Ref Range Status  ?03/31/2021 25.4 (  L) 26.6 - 33.0 pg Final  ? ?MCV  ?Date Value Ref Range Status  ?03/31/2021 81 79 - 97 fL Final  ? ?No results found for: PLTCOUNTKUC, LABPLAT, [place] ?RDW  ?Date Value Ref Range Status  ?03/31/2021 15.7 (H) 11.7 - 15.4 % Final  ? ?  ?  ?  ? ?

## 2022-02-05 ENCOUNTER — Other Ambulatory Visit: Payer: Self-pay | Admitting: Family Medicine

## 2022-02-12 ENCOUNTER — Other Ambulatory Visit: Payer: Self-pay | Admitting: Family Medicine

## 2022-03-10 ENCOUNTER — Other Ambulatory Visit: Payer: Self-pay | Admitting: Family Medicine

## 2022-03-10 DIAGNOSIS — E119 Type 2 diabetes mellitus without complications: Secondary | ICD-10-CM

## 2022-03-31 ENCOUNTER — Encounter: Payer: Self-pay | Admitting: Family Medicine

## 2022-03-31 ENCOUNTER — Ambulatory Visit (INDEPENDENT_AMBULATORY_CARE_PROVIDER_SITE_OTHER): Payer: Medicare HMO | Admitting: Family Medicine

## 2022-03-31 VITALS — BP 138/70 | HR 82 | Resp 16 | Ht 65.0 in | Wt 220.0 lb

## 2022-03-31 DIAGNOSIS — E119 Type 2 diabetes mellitus without complications: Secondary | ICD-10-CM

## 2022-03-31 DIAGNOSIS — F330 Major depressive disorder, recurrent, mild: Secondary | ICD-10-CM

## 2022-03-31 DIAGNOSIS — Z0000 Encounter for general adult medical examination without abnormal findings: Secondary | ICD-10-CM

## 2022-03-31 DIAGNOSIS — M25551 Pain in right hip: Secondary | ICD-10-CM

## 2022-03-31 DIAGNOSIS — Z6830 Body mass index (BMI) 30.0-30.9, adult: Secondary | ICD-10-CM

## 2022-03-31 DIAGNOSIS — E7800 Pure hypercholesterolemia, unspecified: Secondary | ICD-10-CM

## 2022-03-31 DIAGNOSIS — F411 Generalized anxiety disorder: Secondary | ICD-10-CM

## 2022-03-31 DIAGNOSIS — I10 Essential (primary) hypertension: Secondary | ICD-10-CM

## 2022-03-31 NOTE — Progress Notes (Signed)
Complete physical exam  I,Sarah Carson,acting as a scribe for Sarah Mans, MD.,have documented all relevant documentation on the behalf of Sarah Mans, MD,as directed by  Sarah Mans, MD while in the presence of Sarah Mans, MD.   [name]   DOB: [birth year]   66 y.o. Female  MRN: [id number] Visit Date: 03/31/2022  Today's healthcare provider: [name], MD   Chief Complaint  Patient presents with   Annual Exam   Subjective    Sarah Carson is a 66 y.o. female who presents today for a complete physical exam.  She reports consuming a general diet. The patient does not participate in regular exercise at present. She generally feels poorly. She reports sleeping fairly well. She does not have additional problems to discuss today.  She lives alone up in  [facility] and is a widow.  She feels she has no help if she has intervention for any of her arthritic problems. She complains of low back pain that still hurts on the left side.  She has had surgery for this and has been partially helpful. She complains of bilateral hip pains and needs a total hip replacement.  Right is worse than her left.  Is willing to at least see orthopedics about this problem. She is historically very noncompliant with her follow-up.  Very nice lady. HPI  Patient had AWV with [facility] on 10/27/2021.  Past Medical History:  Diagnosis Date   Diabetes (HCC)    Hypertension    Past Surgical History:  Procedure Laterality Date   ABDOMINAL HYSTERECTOMY     BREAST BIOPSY Right 2013   benign, with marker   BREAST EXCISIONAL BIOPSY Bilateral yrs ago   benign   BREAST SURGERY     CHOLECYSTECTOMY     KNEE ARTHROSCOPY     RIGHT OOPHORECTOMY     TONSILLECTOMY     Social History   Socioeconomic History   Marital status: Widowed    Spouse name: Sarah Carson   Number of children: 2   Years of education: college   Highest education level: Not on file  Occupational  History   Occupation: Print production planner  Tobacco Use   Smoking status: Never   Smokeless tobacco: Never  Vaping Use   Vaping Use: Never used  Substance and Sexual Activity   Alcohol use: No    Alcohol/week: 0.0 standard drinks of alcohol   Drug use: No   Sexual activity: Not Currently  Other Topics Concern   Not on file  Social History Narrative   Not on file   Social Determinants of Health   Financial Resource Strain: Low Risk  (10/27/2021)   Overall Financial Resource Strain (CARDIA)    Difficulty of Paying Living Expenses: Not hard at all  Food Insecurity: No Food Insecurity (10/27/2021)   Hunger Vital Sign    Worried About Running Out of Food in the Last Year: Never true    Ran Out of Food in the Last Year: Never true  Transportation Needs: No Transportation Needs (10/27/2021)   PRAPARE - Administrator, Civil Service (Medical): No    Lack of Transportation (Non-Medical): No  Physical Activity: Inactive (10/27/2021)   Exercise Vital Sign    Days of Exercise per Week: 0 days    Minutes of Exercise per Session: 0 min  Stress: No Stress Concern Present (10/27/2021)   [employer] of Occupational Health - Occupational Stress Questionnaire  Feeling of Stress : Only a little  Social Connections: Moderately Isolated (10/27/2021)   Social Connection and Isolation Panel [NHANES]    Frequency of Communication with Friends and Family: More than three times a week    Frequency of Social Gatherings with Friends and Family: Three times a week    Attends Religious Services: More than 4 times per year    Active Member of Clubs or Organizations: Not on file    Attends Club or Organization Meetings: Never    Marital Status: Widowed  Intimate Partner Violence: Not At Risk (10/27/2021)   Humiliation, Afraid, Rape, and Kick questionnaire    Fear of Current or Ex-Partner: No    Emotionally Abused: No    Physically Abused: No    Sexually Abused: No   Family Status  Relation  Name Status   Mother  Alive   Father  Deceased   Sister  Alive   Sister  Alive   GChild  Deceased at age Teenager       Drug overdose   MGM  (Not Specified)   MGF  (Not Specified)   PGF  (Not Specified)   Family History  Problem Relation Age of Onset   Hypertension Mother    Heart disease Mother    Diabetes Mother    Arthritis Mother    Alcohol abuse Father    Heart disease Father        died of MI age 61   Neuromuscular disorder Sister    Neuromuscular disorder Sister    Diabetes Maternal Grandmother    Arthritis Maternal Grandfather        rheumatoid   Cancer Paternal Grandfather    Allergies  Allergen Reactions   Meloxicam Anaphylaxis   Canagliflozin     Other reaction(s): Other (See Comments) Pancreatitis   Darvon [Propoxyphene]    Gabapentin     Muscle aches   Hydrocodone    Ketorolac Other (See Comments)    Vomiting, severe lower back pain/cramping and abdomen area.   Liraglutide    Metoprolol     Other reaction(s): Unknown   Toradol [Ketorolac Tromethamine] Other (See Comments)    Vomiting, severe lower back pain/cramping and abdomen area.   Vioxx [Rofecoxib] Other (See Comments)   Xopenex [Levalbuterol]    Hydrocodone-Acetaminophen Itching and Rash    Per patient   Hydrocodone-Acetaminophen Itching and Rash    Per patient    Patient Care Team: Maple Hudson., MD as PCP - General (Family Medicine)   Medications: Outpatient Medications Prior to Visit  Medication Sig   glucose blood (ACCU-CHEK COMPACT STRIPS) test strip Check sugar once daily-need QS 30 day supply. DX E11.9   Lancet Device MISC One Touch Ultra Mini Lancets   Lancets (ONETOUCH DELICA PLUS LANCET33G) MISC See admin instructions.   metFORMIN (GLUCOPHAGE) 1000 MG tablet TAKE 1 TABLET (1,000 MG TOTAL) BY MOUTH IN THE MORNING AND AT BEDTIME   omeprazole (PRILOSEC) 40 MG capsule TAKE 1 CAPSULE BY MOUTH EVERY DAY   pioglitazone (ACTOS) 30 MG tablet Take 1 tablet (30 mg total) by  mouth daily.   valsartan (DIOVAN) 40 MG tablet TAKE 1 TABLET BY MOUTH EVERY DAY   Verapamil HCl CR 300 MG CP24 TAKE 1 CAPSULE BY MOUTH ONCE DAILY   [DISCONTINUED] diazepam (VALIUM) 5 MG tablet SMARTSIG:1 Tablet(s) By Mouth (Patient not taking: Reported on 11/23/2021)   [DISCONTINUED] glucose blood (ONETOUCH VERIO) test strip Use as instructed   [DISCONTINUED] metFORMIN (GLUCOPHAGE) 500 MG tablet  Take by mouth.   [DISCONTINUED] methocarbamol (ROBAXIN) 500 MG tablet Take by mouth.   [DISCONTINUED] Oxycodone HCl 10 MG TABS TAKE 1 TABLET BY MOUTH THREE TIMES A DAY AS NEEDED FOR PAIN   [DISCONTINUED] oxycodone-acetaminophen (PERCOCET) 2.5-325 MG tablet  (Patient not taking: Reported on 11/23/2021)   [DISCONTINUED] oxyCODONE-acetaminophen (PERCOCET/ROXICET) 5-325 MG tablet  (Patient not taking: Reported on 11/23/2021)   [DISCONTINUED] oxyCODONE-acetaminophen (PERCOCET/ROXICET) 5-325 MG tablet Take by mouth. (Patient not taking: Reported on 11/23/2021)   [DISCONTINUED] pregabalin (LYRICA) 25 MG capsule    [DISCONTINUED] tizanidine (ZANAFLEX) 2 MG capsule Take 4 mg by mouth every 8 (eight) hours as needed. (Patient not taking: Reported on 10/27/2021)   No facility-administered medications prior to visit.    Review of Systems  All other systems reviewed and are negative.   Last hemoglobin A1c Lab Results  Component Value Date   HGBA1C 8.5 (A) 11/23/2021      Objective     BP 138/70 (BP Location: Right Arm, Patient Position: Sitting, Cuff Size: Large)   Pulse 82   Resp 16   Ht 5\' 5"  (1.651 m)   Wt 220 lb (99.8 kg)   SpO2 97%   BMI 36.61 kg/m  BP Readings from Last 3 Encounters:  03/31/22 138/70  11/23/21 125/79  07/23/21 130/64   Wt Readings from Last 3 Encounters:  03/31/22 220 lb (99.8 kg)  11/23/21 211 lb (95.7 kg)  07/23/21 207 lb (93.9 kg)       Physical Exam Vitals reviewed.  Constitutional:      Appearance: Normal appearance. She is well-developed.  HENT:     Head:  Normocephalic and atraumatic.     Right Ear: Tympanic membrane and external ear normal.     Left Ear: Tympanic membrane and external ear normal.     Nose: Nose normal.     Mouth/Throat:     Pharynx: Oropharynx is clear.  Eyes:     General: No scleral icterus.    Conjunctiva/sclera: Conjunctivae normal.  Neck:     Thyroid: No thyromegaly.  Cardiovascular:     Rate and Rhythm: Normal rate and regular rhythm.     Heart sounds: Normal heart sounds.  Pulmonary:     Effort: Pulmonary effort is normal.     Breath sounds: Normal breath sounds.  Abdominal:     Palpations: Abdomen is soft.  Musculoskeletal:        General: Normal range of motion.     Cervical back: Normal range of motion.  Skin:    General: Skin is warm and dry.  Neurological:     General: No focal deficit present.     Mental Status: She is alert and oriented to person, place, and time.     Cranial Nerves: No cranial nerve deficit.  Psychiatric:        Mood and Affect: Mood normal.        Behavior: Behavior normal.        Thought Content: Thought content normal.        Judgment: Judgment normal.       Last depression screening scores    03/31/2022   11:43 AM 11/23/2021   11:36 AM 10/27/2021    8:33 AM  PHQ 2/9 Scores  PHQ - 2 Score 0 0 0  PHQ- 9 Score 0 0    Last fall risk screening    03/31/2022   11:42 AM  Fall Risk   Falls in the past year? 0  Number falls  in past yr: 0  Injury with Fall? 0  Risk for fall due to : Impaired balance/gait;Impaired mobility;Orthopedic patient  Follow up Falls evaluation completed   Last Audit-C alcohol use screening    03/31/2022   11:42 AM  Alcohol Use Disorder Test (AUDIT)  1. How often do you have a drink containing alcohol? 0  2. How many drinks containing alcohol do you have on a typical day when you are drinking? 0  3. How often do you have six or more drinks on one occasion? 0  AUDIT-C Score 0   A score of 3 or more in women, and 4 or more in men indicates  increased risk for alcohol abuse, EXCEPT if all of the points are from question 1   No results found for any visits on 03/31/22.  Assessment & Plan    Routine Health Maintenance and Physical Exam  Exercise Activities and Dietary recommendations  Goals      DIET - EAT MORE FRUITS AND VEGETABLES        Immunization History  Administered Date(s) Administered   PPD Test 04/25/2018   Tdap 03/21/2007   Zoster, Live 07/22/2011    Health Maintenance  Topic Date Due   COVID-19 Vaccine (1) Never done   FOOT EXAM  Never done   Zoster Vaccines- Shingrix (1 of 2) Never done   TETANUS/TDAP  03/20/2017   Diabetic kidney evaluation - Urine ACR  08/01/2018   Pneumonia Vaccine 21+ Years old (1 - PCV) Never done   DEXA SCAN  Never done   OPHTHALMOLOGY EXAM  08/04/2021   MAMMOGRAM  11/20/2021   Diabetic kidney evaluation - GFR measurement  03/31/2022   INFLUENZA VACCINE  04/13/2022   HEMOGLOBIN A1C  05/26/2022   COLONOSCOPY (Pts 45-40yrs Insurance coverage will need to be confirmed)  12/07/2022   Hepatitis C Screening  Completed   HPV VACCINES  Aged Out    Discussed health benefits of physical activity, and encouraged her to engage in regular exercise appropriate for her age and condition.  1. Annual physical exam Patient has not had mammogram in 2 years and colonoscopy in 9 years.  I would like for her to follow-up on these things.  She is not very big on testing.  Declines tetanus shot but is not covered but she also declines Prevnar and flu shots  2. Type 2 diabetes mellitus without complication, without long-term current use of insulin (HCC)  - Lipid panel - TSH - CBC w/Diff/Platelet - Comprehensive Metabolic Panel (CMET) - Hemoglobin A1c  3. Essential (primary) hypertension  - Lipid panel - TSH - CBC w/Diff/Platelet - Comprehensive Metabolic Panel (CMET) - Hemoglobin A1c  4. Pure hypercholesterolemia  - Lipid panel - TSH - CBC w/Diff/Platelet - Comprehensive  Metabolic Panel (CMET) - Hemoglobin A1c  5. BMI 30.0-30.9,adult  - Lipid panel - TSH - CBC w/Diff/Platelet - Comprehensive Metabolic Panel (CMET) - Hemoglobin A1c  6. Depression, major, recurrent, mild (HCC) PHQ-9 on next visit - Lipid panel - TSH - CBC w/Diff/Platelet - Comprehensive Metabolic Panel (CMET) - Hemoglobin A1c  7. Anxiety, generalized GAD-7 on next visit - Lipid panel - TSH - CBC w/Diff/Platelet - Comprehensive Metabolic Panel (CMET) - Hemoglobin A1c  8. Right hip pain  - AMB referral to orthopedics   Return in about 6 months (around 10/01/2022).     I, Sarah Mans, MD, have reviewed all documentation for this visit. The documentation on 04/03/22 for the exam, diagnosis, procedures, and orders are  all accurate and complete.    Cherylanne Ardelean Cranford Mon, MD  [facility] [phone] (phone) [phone] (fax)  [place]

## 2022-04-03 LAB — CBC WITH DIFFERENTIAL/PLATELET
Basophils Absolute: 0 10*3/uL (ref 0.0–0.2)
Basos: 1 %
EOS (ABSOLUTE): 0.2 10*3/uL (ref 0.0–0.4)
Eos: 2 %
Hematocrit: 32.9 % — ABNORMAL LOW (ref 34.0–46.6)
Hemoglobin: 10.6 g/dL — ABNORMAL LOW (ref 11.1–15.9)
Immature Grans (Abs): 0 10*3/uL (ref 0.0–0.1)
Immature Granulocytes: 0 %
Lymphocytes Absolute: 2.7 10*3/uL (ref 0.7–3.1)
Lymphs: 32 %
MCH: 25.9 pg — ABNORMAL LOW (ref 26.6–33.0)
MCHC: 32.2 g/dL (ref 31.5–35.7)
MCV: 80 fL (ref 79–97)
Monocytes Absolute: 0.7 10*3/uL (ref 0.1–0.9)
Monocytes: 8 %
Neutrophils Absolute: 4.7 10*3/uL (ref 1.4–7.0)
Neutrophils: 57 %
Platelets: 243 10*3/uL (ref 150–450)
RBC: 4.09 x10E6/uL (ref 3.77–5.28)
RDW: 15.4 % (ref 11.7–15.4)
WBC: 8.3 10*3/uL (ref 3.4–10.8)

## 2022-04-03 LAB — COMPREHENSIVE METABOLIC PANEL
ALT: 16 IU/L (ref 0–32)
AST: 17 IU/L (ref 0–40)
Albumin/Globulin Ratio: 2 (ref 1.2–2.2)
Albumin: 4.4 g/dL (ref 3.9–4.9)
Alkaline Phosphatase: 77 IU/L (ref 44–121)
BUN/Creatinine Ratio: 16 (ref 12–28)
BUN: 10 mg/dL (ref 8–27)
Bilirubin Total: 0.2 mg/dL (ref 0.0–1.2)
CO2: 22 mmol/L (ref 20–29)
Calcium: 9.5 mg/dL (ref 8.7–10.3)
Chloride: 101 mmol/L (ref 96–106)
Creatinine, Ser: 0.61 mg/dL (ref 0.57–1.00)
Globulin, Total: 2.2 g/dL (ref 1.5–4.5)
Glucose: 208 mg/dL — ABNORMAL HIGH (ref 70–99)
Potassium: 4.5 mmol/L (ref 3.5–5.2)
Sodium: 138 mmol/L (ref 134–144)
Total Protein: 6.6 g/dL (ref 6.0–8.5)
eGFR: 97 mL/min/{1.73_m2} (ref >59)

## 2022-04-03 LAB — LIPID PANEL
Chol/HDL Ratio: 3.1 ratio (ref 0.0–4.4)
Cholesterol, Total: 175 mg/dL (ref 100–199)
HDL: 56 mg/dL (ref >39)
LDL Chol Calc (NIH): 90 mg/dL (ref 0–99)
Triglycerides: 167 mg/dL — ABNORMAL HIGH (ref 0–149)
VLDL Cholesterol Cal: 29 mg/dL (ref 5–40)

## 2022-04-03 LAB — TSH: TSH: 2.68 u[IU]/mL (ref 0.450–4.500)

## 2022-04-03 LAB — HEMOGLOBIN A1C
Est. average glucose Bld gHb Est-mCnc: 223 mg/dL
Hgb A1c MFr Bld: 9.4 % — ABNORMAL HIGH (ref 4.8–5.6)

## 2022-05-11 ENCOUNTER — Other Ambulatory Visit: Payer: Self-pay | Admitting: Orthopedic Surgery

## 2022-05-11 ENCOUNTER — Encounter: Payer: Self-pay | Admitting: Orthopedic Surgery

## 2022-05-11 ENCOUNTER — Other Ambulatory Visit: Payer: Self-pay | Admitting: Family Medicine

## 2022-05-11 DIAGNOSIS — E119 Type 2 diabetes mellitus without complications: Secondary | ICD-10-CM

## 2022-05-11 DIAGNOSIS — Z01818 Encounter for other preprocedural examination: Secondary | ICD-10-CM

## 2022-05-11 NOTE — H&P (Deleted)
  The note originally documented on this encounter has been moved the the encounter in which it belongs.  

## 2022-05-11 NOTE — H&P (Signed)
NAME: Sarah Carson MRN:   [id number] DOB:   [birth year]     HISTORY AND PHYSICAL  CHIEF COMPLAINT:  right hip pain  HISTORY:   Sarah Axe Hinshawis a 66 y.o. female  with right  Hip Pain Patient complains of right hip pain. Onset of the symptoms was several years ago. Inciting event: none. The patient reports the hip pain is worse with weight bearing. Associated symptoms: none. Aggravating symptoms include: any weight bearing. Patient has had no prior hip problems. Previous visits for this problem: yes, last seen several weeks ago by Dr. Odis Luster . Evaluation to date: plain films, which were abnormal  osteoarthritis . Treatment to date: OTC analgesics, which have been somewhat effective, prescription analgesics, which have been somewhat effective, and home exercise program, which has been somewhat effective.    Plan for right total hip replacement  PAST MEDICAL HISTORY:   Past Medical History:  Diagnosis Date   Diabetes (HCC)    Hypertension     PAST SURGICAL HISTORY:   Past Surgical History:  Procedure Laterality Date   ABDOMINAL HYSTERECTOMY     BREAST BIOPSY Right 2013   benign, with marker   BREAST EXCISIONAL BIOPSY Bilateral yrs ago   benign   BREAST SURGERY     CHOLECYSTECTOMY     KNEE ARTHROSCOPY     RIGHT OOPHORECTOMY     TONSILLECTOMY      MEDICATIONS:  (Not in a hospital admission)   ALLERGIES:   Allergies  Allergen Reactions   Meloxicam Anaphylaxis   Canagliflozin     Other reaction(s): Other (See Comments) Pancreatitis   Darvon [Propoxyphene]    Gabapentin     Muscle aches   Hydrocodone    Ketorolac Other (See Comments)    Vomiting, severe lower back pain/cramping and abdomen area.   Liraglutide    Metoprolol     Other reaction(s): Unknown   Toradol [Ketorolac Tromethamine] Other (See Comments)    Vomiting, severe lower back pain/cramping and abdomen area.   Vioxx [Rofecoxib] Other (See Comments)   Xopenex [Levalbuterol]    Hydrocodone-Acetaminophen  Itching and Rash    Per patient   Hydrocodone-Acetaminophen Itching and Rash    Per patient    REVIEW OF SYSTEMS:   Negative except HPI  FAMILY HISTORY:   Family History  Problem Relation Age of Onset   Hypertension Mother    Heart disease Mother    Diabetes Mother    Arthritis Mother    Alcohol abuse Father    Heart disease Father        died of MI age 21   Neuromuscular disorder Sister    Neuromuscular disorder Sister    Diabetes Maternal Grandmother    Arthritis Maternal Grandfather        rheumatoid   Cancer Paternal Grandfather     SOCIAL HISTORY:   reports that she has never smoked. She has never used smokeless tobacco. She reports that she does not drink alcohol and does not use drugs.  PHYSICAL EXAM:  General appearance: alert, cooperative, and no distress Neck: no JVD and supple, symmetrical, trachea midline Resp: clear to auscultation bilaterally Cardio: regular rate and rhythm, S1, S2 normal, no murmur, click, rub or gallop GI: soft, non-tender; bowel sounds normal; no masses,  no organomegaly Pulses: 2+ and symmetric Skin: Skin color, texture, turgor normal. No rashes or lesions Neurologic: Alert and oriented X 3, normal strength and tone. Normal symmetric reflexes. Normal coordination and gait    LABORATORY  STUDIES: No results for input(s): "WBC", "HGB", "HCT", "PLT" in the last 72 hours.  No results for input(s): "NA", "K", "CL", "CO2", "GLUCOSE", "BUN", "CREATININE", "CALCIUM" in the last 72 hours.  STUDIES/RESULTS:  No results found.  ASSESSMENT:  End stage osteoarthritis right hip        Active Problems:   * No active hospital problems. *    PLAN:  Right Primary Total Hip   Altamese Cabal 05/11/2022. 8:47 AM

## 2022-05-12 ENCOUNTER — Other Ambulatory Visit: Payer: Self-pay | Admitting: Orthopedic Surgery

## 2022-05-13 ENCOUNTER — Other Ambulatory Visit: Payer: Self-pay

## 2022-05-13 ENCOUNTER — Encounter
Admission: RE | Admit: 2022-05-13 | Discharge: 2022-05-13 | Disposition: A | Discharge status: Home / Self Care | Payer: Medicare HMO | Source: Ambulatory Visit | Attending: Orthopedic Surgery | Admitting: Orthopedic Surgery

## 2022-05-13 VITALS — BP 156/82 | Resp 16 | Ht 65.0 in | Wt 211.0 lb

## 2022-05-13 DIAGNOSIS — Z01810 Encounter for preprocedural cardiovascular examination: Secondary | ICD-10-CM

## 2022-05-13 DIAGNOSIS — Z01812 Encounter for preprocedural laboratory examination: Secondary | ICD-10-CM

## 2022-05-13 DIAGNOSIS — I10 Essential (primary) hypertension: Secondary | ICD-10-CM | POA: Diagnosis not present

## 2022-05-13 DIAGNOSIS — Z01818 Encounter for other preprocedural examination: Secondary | ICD-10-CM

## 2022-05-13 HISTORY — DX: Chronic obstructive pulmonary disease, unspecified: J44.9

## 2022-05-13 HISTORY — DX: Cardiac arrhythmia, unspecified: I49.9

## 2022-05-13 HISTORY — DX: Pneumonia, unspecified organism: J18.9

## 2022-05-13 HISTORY — DX: Anemia, unspecified: D64.9

## 2022-05-13 HISTORY — DX: Gastro-esophageal reflux disease without esophagitis: K21.9

## 2022-05-13 HISTORY — DX: Other specified postprocedural states: Z98.890

## 2022-05-13 HISTORY — DX: Nausea with vomiting, unspecified: R11.2

## 2022-05-13 HISTORY — DX: Unspecified osteoarthritis, unspecified site: M19.90

## 2022-05-13 LAB — CBC
HCT: 34.4 % — ABNORMAL LOW (ref 36.0–46.0)
Hemoglobin: 11 g/dL — ABNORMAL LOW (ref 12.0–15.0)
MCH: 26.1 pg (ref 26.0–34.0)
MCHC: 32 g/dL (ref 30.0–36.0)
MCV: 81.5 fL (ref 80.0–100.0)
Platelets: 257 10*3/uL (ref 150–400)
RBC: 4.22 MIL/uL (ref 3.87–5.11)
RDW: 14.9 % (ref 11.5–15.5)
WBC: 11.7 10*3/uL — ABNORMAL HIGH (ref 4.0–10.5)
nRBC: 0 % (ref 0.0–0.2)

## 2022-05-13 LAB — BASIC METABOLIC PANEL
Anion gap: 11 (ref 5–15)
BUN: 14 mg/dL (ref 8–23)
CO2: 22 mmol/L (ref 22–32)
Calcium: 9.9 mg/dL (ref 8.9–10.3)
Chloride: 106 mmol/L (ref 98–111)
Creatinine, Ser: 0.59 mg/dL (ref 0.44–1.00)
GFR, Estimated: >60 mL/min (ref >60)
Glucose, Bld: 144 mg/dL — ABNORMAL HIGH (ref 70–99)
Potassium: 3.9 mmol/L (ref 3.5–5.1)
Sodium: 139 mmol/L (ref 135–145)

## 2022-05-13 LAB — URINALYSIS, ROUTINE W REFLEX MICROSCOPIC
Bilirubin Urine: NEGATIVE
Glucose, UA: NEGATIVE mg/dL
Hgb urine dipstick: NEGATIVE
Ketones, ur: 5 mg/dL — AB
Leukocytes,Ua: NEGATIVE
Nitrite: NEGATIVE
Protein, ur: 30 mg/dL — AB
Specific Gravity, Urine: 1.03 (ref 1.005–1.030)
pH: 5 (ref 5.0–8.0)

## 2022-05-13 LAB — SURGICAL PCR SCREEN
MRSA, PCR: NEGATIVE
Staphylococcus aureus: POSITIVE — AB

## 2022-05-13 NOTE — Patient Instructions (Addendum)
Your procedure is scheduled on: 05/25/22 - Tuesday Report to the Registration Desk on the 1st floor of the [facility]. To find out your arrival time, please call [phone] between 1PM - 3PM on: 05/24/22 - Monday  If your arrival time is 6:00 am, do not arrive prior to that time as the [facility] entrance doors do not open until 6:00 am.  REMEMBER: Instructions that are not followed completely may result in serious medical risk, up to and including death; or upon the discretion of your surgeon and anesthesiologist your surgery may need to be rescheduled.  Do not eat food or drink any fluids after midnight the night before surgery.  No gum chewing, lozengers or hard candies.   TAKE THESE MEDICATIONS THE MORNING OF SURGERY WITH A SIP OF WATER:  - omeprazole (PRILOSEC) , (take one the night before and one on the morning of surgery - helps to prevent nausea after surgery.)   STOP metFORMIN (GLUCOPHAGE) beginning 05/23/22 , may resume taking the day after surgery.  One week prior to surgery: Stop Anti-inflammatories (NSAIDS) such as Advil, Aleve, Ibuprofen, Motrin, Naproxen, Naprosyn and Aspirin based products such as Excedrin, Goodys Powder, BC Powder.  Stop ANY OVER THE COUNTER supplements until after surgery.  You may however, continue to take Tylenol if needed for pain up until the day of surgery.  No Alcohol for 24 hours before or after surgery.  No Smoking including e-cigarettes for 24 hours prior to surgery.  No chewable tobacco products for at least 6 hours prior to surgery.  No nicotine patches on the day of surgery.  Do not use any "recreational" drugs for at least a week prior to your surgery.  Please be advised that the combination of cocaine and anesthesia may have negative outcomes, up to and including death. If you test positive for cocaine, your surgery will be cancelled.  On the morning of surgery brush your teeth with toothpaste and water, you may rinse your  mouth with mouthwash if you wish. Do not swallow any toothpaste or mouthwash.  Use CHG Soap or wipes as directed on instruction sheet.  Do not wear jewelry, make-up, hairpins, clips or nail polish.  Do not wear lotions, powders, or perfumes.   Do not shave body from the neck down 48 hours prior to surgery just in case you cut yourself which could leave a site for infection.  Also, freshly shaved skin may become irritated if using the CHG soap.  Contact lenses, hearing aids and dentures may not be worn into surgery.  Do not bring valuables to the hospital. [facility] is not responsible for any missing/lost belongings or valuables.   Notify your doctor if there is any change in your medical condition (cold, fever, infection).  Wear comfortable clothing (specific to your surgery type) to the hospital.  After surgery, you can help prevent lung complications by doing breathing exercises.  Take deep breaths and cough every 1-2 hours. Your doctor may order a device called an Incentive Spirometer to help you take deep breaths. When coughing or sneezing, hold a pillow firmly against your incision with both hands. This is called "splinting." Doing this helps protect your incision. It also decreases belly discomfort.  If you are being admitted to the hospital overnight, leave your suitcase in the car. After surgery it may be brought to your room.  If you are being discharged the day of surgery, you will not be allowed to drive home. You will need a  responsible adult (18 years or older) to drive you home and stay with you that night.   If you are taking public transportation, you will need to have a responsible adult (18 years or older) with you. Please confirm with your physician that it is acceptable to use public transportation.   Please call the [place] at [phone] if you have any questions about these instructions.  Surgery Visitation Policy:  Patients  undergoing a surgery or procedure may have two family members or support persons with them as long as the person is not COVID-19 positive or experiencing its symptoms.   Inpatient Visitation:    Visiting hours are 7 a.m. to 8 p.m. Up to four visitors are allowed at one time in a patient room, including children. The visitors may rotate out with other people during the day. One designated support person (adult) may remain overnight.

## 2022-05-24 ENCOUNTER — Other Ambulatory Visit: Payer: Self-pay | Admitting: Family Medicine

## 2022-05-24 MED ORDER — POVIDONE-IODINE 10 % EX SWAB
2.0000 | Freq: Once | CUTANEOUS | Status: AC
  Administered 2022-05-25: 2 via TOPICAL

## 2022-05-24 MED ORDER — CEFAZOLIN SODIUM-DEXTROSE 2-4 GM/100ML-% IV SOLN
2.0000 g | INTRAVENOUS | Status: AC
  Administered 2022-05-25: 2 g via INTRAVENOUS

## 2022-05-24 MED ORDER — TRANEXAMIC ACID-NACL 1000-0.7 MG/100ML-% IV SOLN
1000.0000 mg | INTRAVENOUS | Status: AC
  Administered 2022-05-25: 1000 mg via INTRAVENOUS

## 2022-05-24 MED ORDER — ORAL CARE MOUTH RINSE: 15.0000 mL | Freq: Once | OROMUCOSAL | Status: AC

## 2022-05-24 MED ORDER — SODIUM CHLORIDE 0.9 % IV SOLN: INTRAVENOUS | Status: DC

## 2022-05-24 MED ORDER — CHLORHEXIDINE GLUCONATE 0.12 % MT SOLN: 15.0000 mL | Freq: Once | OROMUCOSAL | Status: AC

## 2022-05-25 ENCOUNTER — Ambulatory Visit: Payer: Medicare HMO

## 2022-05-25 ENCOUNTER — Ambulatory Visit: Payer: Medicare HMO | Admitting: Anesthesiology

## 2022-05-25 ENCOUNTER — Observation Stay
Admission: RE | Admit: 2022-05-25 | Discharge: 2022-05-26 | Disposition: A | Discharge status: Home Health | Payer: Medicare HMO | Attending: Orthopedic Surgery | Admitting: Orthopedic Surgery

## 2022-05-25 ENCOUNTER — Other Ambulatory Visit: Payer: Self-pay

## 2022-05-25 ENCOUNTER — Ambulatory Visit: Payer: Medicare HMO | Admitting: Urgent Care

## 2022-05-25 ENCOUNTER — Encounter: Payer: Self-pay | Admitting: Orthopedic Surgery

## 2022-05-25 ENCOUNTER — Encounter
Admission: RE | Disposition: A | Discharge status: Home Health | Payer: Self-pay | Source: Home / Self Care | Attending: Orthopedic Surgery

## 2022-05-25 DIAGNOSIS — J449 Chronic obstructive pulmonary disease, unspecified: Secondary | ICD-10-CM | POA: Insufficient documentation

## 2022-05-25 DIAGNOSIS — Z01812 Encounter for preprocedural laboratory examination: Secondary | ICD-10-CM

## 2022-05-25 DIAGNOSIS — I10 Essential (primary) hypertension: Secondary | ICD-10-CM | POA: Insufficient documentation

## 2022-05-25 DIAGNOSIS — M1611 Unilateral primary osteoarthritis, right hip: Principal | ICD-10-CM | POA: Insufficient documentation

## 2022-05-25 DIAGNOSIS — Z96641 Presence of right artificial hip joint: Secondary | ICD-10-CM

## 2022-05-25 DIAGNOSIS — E119 Type 2 diabetes mellitus without complications: Secondary | ICD-10-CM | POA: Insufficient documentation

## 2022-05-25 HISTORY — PX: TOTAL HIP ARTHROPLASTY: SHX124

## 2022-05-25 LAB — GLUCOSE, CAPILLARY
Glucose-Capillary: 167 mg/dL — ABNORMAL HIGH (ref 70–99)
Glucose-Capillary: 168 mg/dL — ABNORMAL HIGH (ref 70–99)

## 2022-05-25 SURGERY — ARTHROPLASTY, HIP, TOTAL, ANTERIOR APPROACH
Anesthesia: General | Site: Hip | Laterality: Right

## 2022-05-25 MED ORDER — LACTATED RINGERS IV SOLN: INTRAVENOUS | Status: DC

## 2022-05-25 MED ORDER — BUPIVACAINE HCL (PF) 0.5 % IJ SOLN
INTRAMUSCULAR | Status: DC | PRN
  Administered 2022-05-25: 2.4 mL via INTRATHECAL

## 2022-05-25 MED ORDER — OXYCODONE HCL 5 MG/5ML PO SOLN: 5.0000 mg | Freq: Once | ORAL | Status: AC | PRN

## 2022-05-25 MED ORDER — SODIUM CHLORIDE 0.9 % IR SOLN
Status: DC | PRN
  Administered 2022-05-25: 3000 mL

## 2022-05-25 MED ORDER — TRANEXAMIC ACID-NACL 1000-0.7 MG/100ML-% IV SOLN
INTRAVENOUS | Status: AC
  Filled 2022-05-25: qty 100

## 2022-05-25 MED ORDER — ONDANSETRON HCL 4 MG/2ML IJ SOLN
4.0000 mg | Freq: Four times a day (QID) | INTRAMUSCULAR | Status: DC | PRN
  Administered 2022-05-25: 4 mg via INTRAVENOUS
  Filled 2022-05-25: qty 2

## 2022-05-25 MED ORDER — BUPIVACAINE-EPINEPHRINE (PF) 0.25% -1:200000 IJ SOLN
INTRAMUSCULAR | Status: AC
  Filled 2022-05-25: qty 30

## 2022-05-25 MED ORDER — VERAPAMIL HCL ER 300 MG PO CP24: 300.0000 mg | ORAL_CAPSULE | Freq: Every day | ORAL | Status: DC

## 2022-05-25 MED ORDER — DEXMEDETOMIDINE (PRECEDEX) IN NS 20 MCG/5ML (4 MCG/ML) IV SYRINGE
PREFILLED_SYRINGE | INTRAVENOUS | Status: DC | PRN
  Administered 2022-05-25 (×2): 8 ug via INTRAVENOUS

## 2022-05-25 MED ORDER — PROPOFOL 10 MG/ML IV BOLUS
INTRAVENOUS | Status: AC
  Filled 2022-05-25: qty 20

## 2022-05-25 MED ORDER — ONDANSETRON HCL 4 MG/2ML IJ SOLN
INTRAMUSCULAR | Status: DC | PRN
  Administered 2022-05-25: 4 mg via INTRAVENOUS

## 2022-05-25 MED ORDER — FENTANYL CITRATE (PF) 100 MCG/2ML IJ SOLN
INTRAMUSCULAR | Status: DC | PRN
  Administered 2022-05-25 (×2): 25 ug via INTRAVENOUS

## 2022-05-25 MED ORDER — 0.9 % SODIUM CHLORIDE (POUR BTL) OPTIME
TOPICAL | Status: DC | PRN
  Administered 2022-05-25: 1000 mL

## 2022-05-25 MED ORDER — PROPOFOL 1000 MG/100ML IV EMUL
INTRAVENOUS | Status: AC
  Filled 2022-05-25: qty 100

## 2022-05-25 MED ORDER — METFORMIN HCL 500 MG PO TABS
1000.0000 mg | ORAL_TABLET | Freq: Every day | ORAL | Status: DC
  Administered 2022-05-25 – 2022-05-26 (×2): 1000 mg via ORAL
  Filled 2022-05-25 (×2): qty 2

## 2022-05-25 MED ORDER — ORAL CARE MOUTH RINSE: 15.0000 mL | OROMUCOSAL | Status: DC | PRN

## 2022-05-25 MED ORDER — SCOPOLAMINE 1 MG/3DAYS TD PT72
1.0000 | MEDICATED_PATCH | TRANSDERMAL | Status: DC
  Administered 2022-05-25: 1.5 mg via TRANSDERMAL

## 2022-05-25 MED ORDER — ALUM & MAG HYDROXIDE-SIMETH 200-200-20 MG/5ML PO SUSP: 30.0000 mL | ORAL | Status: DC | PRN

## 2022-05-25 MED ORDER — MIDAZOLAM HCL 2 MG/2ML IJ SOLN
INTRAMUSCULAR | Status: DC | PRN
  Administered 2022-05-25: 2 mg via INTRAVENOUS

## 2022-05-25 MED ORDER — OXYCODONE HCL 5 MG PO TABS
5.0000 mg | ORAL_TABLET | Freq: Once | ORAL | Status: AC | PRN
  Administered 2022-05-25: 5 mg via ORAL

## 2022-05-25 MED ORDER — BUPIVACAINE-MELOXICAM ER 200-6 MG/7ML IJ SOLN
INTRAMUSCULAR | Status: AC
  Filled 2022-05-25: qty 1

## 2022-05-25 MED ORDER — FENTANYL CITRATE (PF) 100 MCG/2ML IJ SOLN: 25.0000 ug | INTRAMUSCULAR | Status: DC | PRN

## 2022-05-25 MED ORDER — MAGNESIUM CITRATE PO SOLN: 1.0000 | Freq: Once | ORAL | Status: DC | PRN

## 2022-05-25 MED ORDER — CEFAZOLIN SODIUM-DEXTROSE 2-4 GM/100ML-% IV SOLN
INTRAVENOUS | Status: AC
  Filled 2022-05-25: qty 100

## 2022-05-25 MED ORDER — BUPIVACAINE-EPINEPHRINE (PF) 0.25% -1:200000 IJ SOLN
INTRAMUSCULAR | Status: DC | PRN
  Administered 2022-05-25: 30 mL

## 2022-05-25 MED ORDER — PROPOFOL 500 MG/50ML IV EMUL
INTRAVENOUS | Status: DC | PRN
  Administered 2022-05-25: 50 ug/kg/min via INTRAVENOUS
  Administered 2022-05-25: 20 mg via INTRAVENOUS

## 2022-05-25 MED ORDER — CHLORHEXIDINE GLUCONATE 0.12 % MT SOLN
OROMUCOSAL | Status: AC
  Administered 2022-05-25: 15 mL via OROMUCOSAL
  Filled 2022-05-25: qty 15

## 2022-05-25 MED ORDER — SURGIPHOR WOUND IRRIGATION SYSTEM - OPTIME
TOPICAL | Status: DC | PRN
  Administered 2022-05-25: 450 mL via TOPICAL

## 2022-05-25 MED ORDER — VERAPAMIL HCL ER 180 MG PO TBCR
300.0000 mg | EXTENDED_RELEASE_TABLET | Freq: Every day | ORAL | Status: DC
  Administered 2022-05-25 – 2022-05-26 (×2): 300 mg via ORAL
  Filled 2022-05-25 (×2): qty 1

## 2022-05-25 MED ORDER — MENTHOL 3 MG MT LOZG: 1.0000 | LOZENGE | OROMUCOSAL | Status: DC | PRN

## 2022-05-25 MED ORDER — MIDAZOLAM HCL 2 MG/2ML IJ SOLN
INTRAMUSCULAR | Status: AC
  Filled 2022-05-25: qty 2

## 2022-05-25 MED ORDER — PIOGLITAZONE HCL 30 MG PO TABS
30.0000 mg | ORAL_TABLET | Freq: Every day | ORAL | Status: DC
  Administered 2022-05-25 – 2022-05-26 (×2): 30 mg via ORAL
  Filled 2022-05-25 (×2): qty 1

## 2022-05-25 MED ORDER — HYDROMORPHONE HCL 1 MG/ML IJ SOLN
0.5000 mg | INTRAMUSCULAR | Status: DC | PRN
  Administered 2022-05-25 – 2022-05-26 (×3): 1 mg via INTRAVENOUS
  Filled 2022-05-25 (×3): qty 1

## 2022-05-25 MED ORDER — DOCUSATE SODIUM 100 MG PO CAPS
100.0000 mg | ORAL_CAPSULE | Freq: Two times a day (BID) | ORAL | Status: DC
  Administered 2022-05-25 – 2022-05-26 (×2): 100 mg via ORAL
  Filled 2022-05-25 (×2): qty 1

## 2022-05-25 MED ORDER — OXYCODONE HCL 5 MG PO TABS
ORAL_TABLET | ORAL | Status: AC
  Filled 2022-05-25: qty 1

## 2022-05-25 MED ORDER — IRBESARTAN 75 MG PO TABS
37.5000 mg | ORAL_TABLET | Freq: Every day | ORAL | Status: DC
  Administered 2022-05-25 – 2022-05-26 (×2): 37.5 mg via ORAL
  Filled 2022-05-25 (×2): qty 0.5

## 2022-05-25 MED ORDER — BUPIVACAINE HCL (PF) 0.5 % IJ SOLN
INTRAMUSCULAR | Status: AC
  Filled 2022-05-25: qty 10

## 2022-05-25 MED ORDER — ACETAMINOPHEN 10 MG/ML IV SOLN: 1000.0000 mg | Freq: Once | INTRAVENOUS | Status: DC | PRN

## 2022-05-25 MED ORDER — RIVAROXABAN 10 MG PO TABS
10.0000 mg | ORAL_TABLET | Freq: Every day | ORAL | Status: DC
  Administered 2022-05-26: 10 mg via ORAL
  Filled 2022-05-25: qty 1

## 2022-05-25 MED ORDER — PANTOPRAZOLE SODIUM 40 MG PO TBEC
80.0000 mg | DELAYED_RELEASE_TABLET | Freq: Every day | ORAL | Status: DC
  Administered 2022-05-26: 80 mg via ORAL
  Filled 2022-05-25: qty 2

## 2022-05-25 MED ORDER — METOCLOPRAMIDE HCL 5 MG/ML IJ SOLN
5.0000 mg | Freq: Three times a day (TID) | INTRAMUSCULAR | Status: DC | PRN
  Administered 2022-05-25: 10 mg via INTRAVENOUS
  Filled 2022-05-25: qty 2

## 2022-05-25 MED ORDER — SCOPOLAMINE 1 MG/3DAYS TD PT72
MEDICATED_PATCH | TRANSDERMAL | Status: AC
  Filled 2022-05-25: qty 1

## 2022-05-25 MED ORDER — BISACODYL 10 MG RE SUPP: 10.0000 mg | Freq: Every day | RECTAL | Status: DC | PRN

## 2022-05-25 MED ORDER — ONDANSETRON HCL 4 MG/2ML IJ SOLN: 4.0000 mg | Freq: Once | INTRAMUSCULAR | Status: DC | PRN

## 2022-05-25 MED ORDER — PHENOL 1.4 % MT LIQD: 1.0000 | OROMUCOSAL | Status: DC | PRN

## 2022-05-25 MED ORDER — FENTANYL CITRATE (PF) 100 MCG/2ML IJ SOLN
INTRAMUSCULAR | Status: AC
  Filled 2022-05-25: qty 2

## 2022-05-25 MED ORDER — ONDANSETRON HCL 4 MG PO TABS: 4.0000 mg | ORAL_TABLET | Freq: Four times a day (QID) | ORAL | Status: DC | PRN

## 2022-05-25 MED ORDER — MAGNESIUM HYDROXIDE 400 MG/5ML PO SUSP
30.0000 mL | Freq: Every day | ORAL | Status: DC | PRN
  Administered 2022-05-26: 30 mL via ORAL
  Filled 2022-05-25: qty 30

## 2022-05-25 MED ORDER — OXYCODONE HCL 5 MG PO TABS
5.0000 mg | ORAL_TABLET | ORAL | Status: DC | PRN
  Administered 2022-05-25: 10 mg via ORAL
  Administered 2022-05-25: 5 mg via ORAL
  Administered 2022-05-26 (×2): 10 mg via ORAL
  Filled 2022-05-25: qty 2
  Filled 2022-05-25 (×2): qty 1
  Filled 2022-05-25: qty 2
  Filled 2022-05-25: qty 1

## 2022-05-25 MED ORDER — METOCLOPRAMIDE HCL 5 MG PO TABS: 5.0000 mg | ORAL_TABLET | Freq: Three times a day (TID) | ORAL | Status: DC | PRN

## 2022-05-25 MED ORDER — ACETAMINOPHEN 10 MG/ML IV SOLN
INTRAVENOUS | Status: DC | PRN
  Administered 2022-05-25: 1000 mg via INTRAVENOUS

## 2022-05-25 MED ORDER — ACETAMINOPHEN 10 MG/ML IV SOLN
INTRAVENOUS | Status: AC
  Filled 2022-05-25: qty 100

## 2022-05-25 MED ORDER — SODIUM CHLORIDE 0.9 % IR SOLN
Status: DC | PRN
  Administered 2022-05-25: 250 mL

## 2022-05-25 MED ORDER — OXYCODONE HCL 5 MG PO TABS
10.0000 mg | ORAL_TABLET | ORAL | Status: DC | PRN
  Administered 2022-05-26: 10 mg via ORAL
  Filled 2022-05-25: qty 2

## 2022-05-25 SURGICAL SUPPLY — 57 items
APL PRP STRL LF DISP 70% ISPRP (MISCELLANEOUS) ×1
BLADE SAGITTAL WIDE XTHICK NO (BLADE) ×2 IMPLANT
BNDG CMPR 5X4 CHSV STRCH STRL (GAUZE/BANDAGES/DRESSINGS) ×2
BNDG COHESIVE 4X5 TAN STRL LF (GAUZE/BANDAGES/DRESSINGS) ×4 IMPLANT
BRUSH SCRUB EZ  4% CHG (MISCELLANEOUS) ×1
BRUSH SCRUB EZ 4% CHG (MISCELLANEOUS) ×2 IMPLANT
CHLORAPREP W/TINT 26 (MISCELLANEOUS) ×2 IMPLANT
COVER HOLE (Hips) IMPLANT
DRAPE 3/4 80X56 (DRAPES) ×2 IMPLANT
DRAPE C-ARM 42X72 X-RAY (DRAPES) ×2 IMPLANT
DRAPE STERI IOBAN 125X83 (DRAPES) IMPLANT
DRAPE U-SHAPE 47X51 STRL (DRAPES) ×2 IMPLANT
DRSG AQUACEL AG ADV 3.5X10 (GAUZE/BANDAGES/DRESSINGS) IMPLANT
DRSG AQUACEL AG ADV 3.5X14 (GAUZE/BANDAGES/DRESSINGS) IMPLANT
ELECT REM PT RETURN 9FT ADLT (ELECTROSURGICAL) ×1
ELECTRODE REM PT RTRN 9FT ADLT (ELECTROSURGICAL) ×2 IMPLANT
GAUZE 4X4 16PLY ~~LOC~~+RFID DBL (SPONGE) ×2 IMPLANT
GAUZE XEROFORM 1X8 LF (GAUZE/BANDAGES/DRESSINGS) IMPLANT
GLOVE BIO SURGEON STRL SZ8 (GLOVE) ×2 IMPLANT
GLOVE BIOGEL PI IND STRL 8.5 (GLOVE) ×4 IMPLANT
GLOVE SURG ORTHO 8.5 STRL (GLOVE) ×2 IMPLANT
GOWN STRL REUS W/ TWL XL LVL3 (GOWN DISPOSABLE) ×4 IMPLANT
GOWN STRL REUS W/TWL XL LVL3 (GOWN DISPOSABLE) ×2
HEAD OXINIUM PLUS 0 32MM (Hips) IMPLANT
HOOD PEEL AWAY FLYTE STAYCOOL (MISCELLANEOUS) ×6 IMPLANT
IV NS 250ML (IV SOLUTION)
IV NS 250ML BAXH (IV SOLUTION) IMPLANT
IV NS IRRIG 3000ML ARTHROMATIC (IV SOLUTION) ×2 IMPLANT
KIT PATIENT CARE HANA TABLE (KITS) ×2 IMPLANT
KIT TURNOVER CYSTO (KITS) ×2 IMPLANT
LINER 3H HEMI SHELL 48MM (Liner) IMPLANT
LINER ACETABULAR 32X48 (Liner) IMPLANT
MANIFOLD NEPTUNE II (INSTRUMENTS) ×2 IMPLANT
MAT ABSORB  FLUID 56X50 GRAY (MISCELLANEOUS) ×1
MAT ABSORB FLUID 56X50 GRAY (MISCELLANEOUS) ×2 IMPLANT
NDL SAFETY ECLIP 18X1.5 (MISCELLANEOUS) IMPLANT
NDL SPNL 20GX3.5 QUINCKE YW (NEEDLE) ×2 IMPLANT
NEEDLE HYPO 22GX1.5 SAFETY (NEEDLE) ×2 IMPLANT
NEEDLE SPNL 20GX3.5 QUINCKE YW (NEEDLE) ×1 IMPLANT
PACK HIP PROSTHESIS (MISCELLANEOUS) ×2 IMPLANT
PADDING CAST BLEND 4X4 NS (MISCELLANEOUS) ×4 IMPLANT
PILLOW ABDUCTION MEDIUM (MISCELLANEOUS) ×2 IMPLANT
PULSAVAC PLUS IRRIG FAN TIP (DISPOSABLE) ×1
SCREW 6.5X25MM (Screw) IMPLANT
SOLUTION IRRIG SURGIPHOR (IV SOLUTION) ×2 IMPLANT
SPONGE T-LAP 18X18 ~~LOC~~+RFID (SPONGE) ×8 IMPLANT
STAPLER SKIN PROX 35W (STAPLE) ×2 IMPLANT
STEM LATERAL COLLAR POLARSTEM (Stem) IMPLANT
SUT BONE WAX W31G (SUTURE) ×2 IMPLANT
SUT DVC 2 QUILL PDO  T11 36X36 (SUTURE) ×1
SUT DVC 2 QUILL PDO T11 36X36 (SUTURE) ×2 IMPLANT
SUT VIC AB 2-0 CT1 18 (SUTURE) ×2 IMPLANT
SYR 20ML LL LF (SYRINGE) ×2 IMPLANT
TIP FAN IRRIG PULSAVAC PLUS (DISPOSABLE) ×2 IMPLANT
TRAP FLUID SMOKE EVACUATOR (MISCELLANEOUS) ×2 IMPLANT
WAND WEREWOLF FASTSEAL 6.0 (MISCELLANEOUS) ×2 IMPLANT
WATER STERILE IRR 500ML POUR (IV SOLUTION) ×2 IMPLANT

## 2022-05-25 NOTE — Anesthesia Preprocedure Evaluation (Addendum)
Anesthesia Evaluation  Patient identified by MRN, date of birth, ID band Patient awake    Reviewed: Allergy & Precautions, NPO status , Patient's Chart, lab work & pertinent test results  History of Anesthesia Complications (+) PONV and history of anesthetic complications  Airway Mallampati: II   Neck ROM: Full    Dental no notable dental hx.    Pulmonary COPD,    Pulmonary exam normal breath sounds clear to auscultation       Cardiovascular hypertension, Normal cardiovascular exam Rhythm:Regular Rate:Normal  ECG 05/13/22: normal  Echo 07/09/21:  NORMAL LEFT VENTRICULAR SYSTOLIC FUNCTION  WITH MODERATE LVH  NORMAL RIGHT VENTRICULAR SYSTOLIC FUNCTION  TRIVIAL REGURGITATION NOTED  NO VALVULAR STENOSIS  ESTIMATED LVEF >55%  Aortic: NORMAL GRADIENTS  AOV: MILDLY THICKENED, FULLY MOBILE LEAFLETS; SCLEROSIS WITH NO EVIDENCE OF STENOSIS; RCC  SCLEROTIC  Mitral: TRACE MR  Tricuspid: TRACE TR  Pulmonic: NORMAL  MILD LAE  MILDLY DILATED MAIN PULMONARY ARTERY MEASURING 3.5cm    Neuro/Psych PSYCHIATRIC DISORDERS Anxiety Depression negative neurological ROS     GI/Hepatic GERD  ,  Endo/Other  diabetes, Type 2Obesity   Renal/GU negative Renal ROS     Musculoskeletal  (+) Arthritis , Rheumatoid disorders,    Abdominal   Peds  Hematology  (+) Blood dyscrasia, anemia , REFUSES BLOOD PRODUCTS (albumin ok),   Anesthesia Other Findings Cardiology note 08/26/21:  1. Palpitations: Patient with reassuring echocardiogram showing normal LV and RV systolic function with an EF of greater than 55%. Holter monitor showing episodes of symptomatic sinus tachycardia as well as frequent preatrial contractions likely explaining the patient's palpitations over the last several months. Explained to the patient that neither of these are harmful in nature, however they can cause a discomfort in her chest when they occur. -No further cardiac  diagnostic testing necessary at this time -Continue to monitor palpitations  2. Hypertension: Patient states that her blood pressure at home is typically around 120 systolic however she has not been taking her blood pressure recently. She is currently on a combination of valsartan and verapamil for blood pressure management. -No changes to blood pressure management at this time.  -Patient to take her blood pressure regularly at home and keep a log that she can bring to her next appointment for better evaluation and management -Patient encouraged to eat a low-sodium diet and get regular physical activity for further hypertension management  3. Hyperlipidemia: Currently not on any cholesterol-lowering medication. Last lipid panel on 03/31/2021 showed an LDL of 95. Patient with a low 10-year cardiovascular risk score with no indication for statin therapy at this time -Continue to monitor lipid panel at future appointments -No changes to medical management at this time   Reproductive/Obstetrics                            Anesthesia Physical Anesthesia Plan  ASA: 2  Anesthesia Plan: General and Spinal   Post-op Pain Management:    Induction: Intravenous  PONV Risk Score and Plan: 4 or greater and Propofol infusion, TIVA, Treatment may vary due to age or medical condition, Ondansetron and Scopolamine patch - Pre-op  Airway Management Planned: Natural Airway and Nasal Cannula  Additional Equipment:   Intra-op Plan:   Post-operative Plan:   Informed Consent: I have reviewed the patients History and Physical, chart, labs and discussed the procedure including the risks, benefits and alternatives for the proposed anesthesia with the patient or authorized representative who  has indicated his/her understanding and acceptance.       Plan Discussed with: CRNA  Anesthesia Plan Comments: (Plan for spinal and GA with natural airway, LMA/GETA backup.  Patient consented  for risks of anesthesia including but not limited to:  - adverse reactions to medications - damage to eyes, teeth, lips or other oral mucosa - nerve damage due to positioning  - sore throat or hoarseness - headache, bleeding, infection, nerve damage 2/2 spinal - damage to heart, brain, nerves, lungs, other parts of body or loss of life  Informed patient about role of CRNA in peri- and intra-operative care.  Patient voiced understanding.)        Anesthesia Quick Evaluation

## 2022-05-25 NOTE — Anesthesia Postprocedure Evaluation (Signed)
Anesthesia Post Note  [name]  Procedure(s) Performed: TOTAL HIP ARTHROPLASTY ANTERIOR APPROACH (Right: Hip)  Patient location during evaluation: PACU Anesthesia Type: Spinal Level of consciousness: oriented and awake and alert Pain management: pain level controlled Vital Signs Assessment: post-procedure vital signs reviewed and stable Respiratory status: spontaneous breathing, respiratory function stable and patient connected to nasal cannula oxygen Cardiovascular status: blood pressure returned to baseline and stable Postop Assessment: no headache, no backache and no apparent nausea or vomiting Anesthetic complications: no   No notable events documented.   Last Vitals:  Vitals:   05/25/22 1130 05/25/22 1145  BP: (!) 115/58 (!) 115/57  Pulse: 95 (!) 57  Resp: (!) 21 13  Temp:    SpO2: 95% 95%    Last Pain:  Vitals:   05/25/22 1145  TempSrc:   PainSc: Asleep                 Louie Boston

## 2022-05-25 NOTE — Transfer of Care (Signed)
Immediate Anesthesia Transfer of Care Note  [name]  Procedure(s) Performed: TOTAL HIP ARTHROPLASTY ANTERIOR APPROACH (Right: Hip)  Patient Location: PACU  Anesthesia Type:Spinal  Level of Consciousness: awake, alert  and oriented  Airway & Oxygen Therapy: Patient Spontanous Breathing  Post-op Assessment: Report given to RN and Post -op Vital signs reviewed and stable  Post vital signs: Reviewed and stable  Last Vitals:  Vitals Value Taken Time  BP 87/72 05/25/22 1104  Temp    Pulse 69 05/25/22 1106  Resp 32 05/25/22 1106  SpO2 96 % 05/25/22 1106  Vitals shown include unvalidated device data.  Last Pain:  Vitals:   05/25/22 0749  TempSrc: Temporal  PainSc: 2          Complications: No notable events documented.

## 2022-05-25 NOTE — Evaluation (Signed)
Physical Therapy Evaluation Patient Details Name: NIKELLE MALATESTA MRN: [id number] DOB: [birth year] Today's Date: 05/25/2022  History of Present Illness  Pt is a 66 yo female s/p R THA. PMH of COPD, HTN, anxiety, depression, DMII, RA.  Clinical Impression  Patient alert, agreeable to PT, did report 6/10 R hip pain. Pt reported at baseline she is independent.   She was able to perform several supine exercises with tactile cues. Educated on precautions, pain management and all questions as able. Supine to sit with supervision, use of bed rails and extended time, good sitting balance noted. The patient did develop nausea, RN notified and returned to supine minA, comfort measures provided.  Overall the patient demonstrated deficits (see "PT Problem List") that impede the patient's functional abilities, safety, and mobility and would benefit from skilled PT intervention. Recommendation at this time is HHPT with frequent/constant supervision pending further progress with mobility.        Recommendations for follow up therapy are one component of a multi-disciplinary discharge planning process, led by the attending physician.  Recommendations may be updated based on patient status, additional functional criteria and insurance authorization.  Follow Up Recommendations Home health PT      Assistance Recommended at Discharge Frequent or constant Supervision/Assistance  Patient can return home with the following  A little help with walking and/or transfers;Assistance with cooking/housework;A little help with bathing/dressing/bathroom;Assist for transportation;Assistance with feeding;Help with stairs or ramp for entrance    Equipment Recommendations Rolling walker (2 wheels);BSC/3in1  Recommendations for Other Services       Functional Status Assessment Patient has had a recent decline in their functional status and demonstrates the ability to make significant improvements in function in a reasonable and  predictable amount of time.     Precautions / Restrictions Precautions Precautions: Fall;Anterior Hip Precaution Booklet Issued: Yes (comment) Restrictions Weight Bearing Restrictions: Yes RLE Weight Bearing: Weight bearing as tolerated      Mobility  Bed Mobility Overal bed mobility: Needs Assistance Bed Mobility: Supine to Sit, Sit to Supine     Supine to sit: Supervision Sit to supine: Min assist   General bed mobility comments: minA to return to bed because of nausea    Transfers                   General transfer comment: unable at this time    Ambulation/Gait               General Gait Details: deferred due to nausea  Stairs            Wheelchair Mobility    Modified Rankin (Stroke Patients Only)       Balance Overall balance assessment: Needs assistance Sitting-balance support: Feet supported Sitting balance-Leahy Scale: Good                                       Pertinent Vitals/Pain Pain Assessment Pain Assessment: 0-10 Pain Score: 6  Pain Location: R hip Pain Descriptors / Indicators: Aching, Sore Pain Intervention(s): Limited activity within patient's tolerance, Monitored during session, Repositioned, Patient requesting pain meds-RN notified    Home Living Family/patient expects to be discharged to:: Private residence Living Arrangements: Alone Available Help at Discharge: Family Type of Home: House Home Access: Ramped entrance       Home Layout: One level Home Equipment: None      Prior Function Prior Level  of Function : Independent/Modified Independent;Driving                     Hand Dominance        Extremity/Trunk Assessment   Upper Extremity Assessment Upper Extremity Assessment: Overall WFL for tasks assessed    Lower Extremity Assessment Lower Extremity Assessment: Overall WFL for tasks assessed (s/p R THA)    Cervical / Trunk Assessment Cervical / Trunk Assessment:  Normal  Communication   Communication: No difficulties  Cognition Arousal/Alertness: Awake/alert Behavior During Therapy: WFL for tasks assessed/performed Overall Cognitive Status: Within Functional Limits for tasks assessed                                          General Comments      Exercises Total Joint Exercises Short Arc Quad: AROM, Strengthening, Right, 10 reps Heel Slides: AROM, Strengthening, Both, 10 reps Hip ABduction/ADduction: AROM, Strengthening, Both, 10 reps   Assessment/Plan    PT Assessment Patient needs continued PT services  PT Problem List Decreased mobility;Decreased strength;Decreased activity tolerance;Decreased balance;Pain;Decreased knowledge of precautions       PT Treatment Interventions DME instruction;Therapeutic exercise;Gait training;Balance training;Stair training;Neuromuscular re-education;Functional mobility training;Therapeutic activities;Patient/family education    PT Goals (Current goals can be found in the Care Plan section)  Acute Rehab PT Goals Patient Stated Goal: to go home PT Goal Formulation: With patient Time For Goal Achievement: 06/08/22 Potential to Achieve Goals: Good    Frequency BID     Co-evaluation               AM-PAC PT "6 Clicks" Mobility  Outcome Measure Help needed turning from your back to your side while in a flat bed without using bedrails?: A Little Help needed moving from lying on your back to sitting on the side of a flat bed without using bedrails?: A Little Help needed moving to and from a bed to a chair (including a wheelchair)?: A Little Help needed standing up from a chair using your arms (e.g., wheelchair or bedside chair)?: A Little Help needed to walk in hospital room?: A Little Help needed climbing 3-5 steps with a railing? : A Little 6 Click Score: 18    End of Session Equipment Utilized During Treatment: Gait belt Activity Tolerance: Patient tolerated treatment  well Patient left: in bed;with call bell/phone within reach;with bed alarm set;with nursing/sitter in room;with family/visitor present Nurse Communication: Mobility status PT Visit Diagnosis: Other abnormalities of gait and mobility (R26.89);Difficulty in walking, not elsewhere classified (R26.2);Muscle weakness (generalized) (M62.81)    Time: 4166-0630 PT Time Calculation (min) (ACUTE ONLY): 28 min   Charges:   PT Evaluation $PT Eval Low Complexity: 1 Low PT Treatments $Therapeutic Exercise: 8-22 mins $Therapeutic Activity: 8-22 mins        Olga Coaster PT, DPT 4:25 PM,05/25/22

## 2022-05-25 NOTE — Op Note (Signed)
05/25/2022  10:55 AM  PATIENT:  Sarah Carson   MRN: [id number]  PRE-OPERATIVE DIAGNOSIS:  Osteoarthritis right hip   POST-OPERATIVE DIAGNOSIS: Same  Procedure: Right Total Hip Replacement  Surgeon: Dola Argyle. Odis Luster, MD   Assist: Altamese Cabal, PA-C  Anesthesia: Spinal   EBL: 100 mL   Specimens: None   Drains: None   Components used: A size 2 lateral Polarstem [employer], R3 size 48 mm shell, and a 32 mm +0 mm head    Description of the procedure in detail: After informed consent was obtained and the appropriate extremity marked in the pre-operative holding area, the patient was taken to the operating room and placed in the supine position on the fracture table. All pressure points were well padded and bilateral lower extremities were place in traction spars. The hip was prepped and draped in standard sterile fashion. A spinal anesthetic had been delivered by the anesthesia team. The skin and subcutaneous tissues were injected with a mixture of Marcaine with epinephrine for post-operative pain. A longitudinal incision approximately 10 cm in length was carried out from the anterior superior iliac spine to the greater trochanter. The tensor fascia was divided and blunt dissection was taken down to the level of the joint capsule. The lateral circumflex vessels were cauterized. Deep retractors were placed and a portion of the anterior capsule was excised. Using fluoroscopy the neck cut was planned and carried out with a sagittal saw. The head was passed from the field with use of a corkscrew and hip skid. Deep retractors were placed along the acetabulum and the degenerative labrum and large osteophytes were removed with a Rongeur. The cup was sequentially reamed to a size 48 mm. The wound was irrigated and using fluoroscopy the size 48 mm cup was impacted in to anatomic position. A single screw was placed followed by a threaded hole cover. The final liner was impacted in to position.  Attention was then turned to the proximal femur. The leg was placed in extension and external rotation. The canal was opened and sequentially broached to a size 2 lateral. The trial components were placed and the hip relocated. The components were found to be in good position using fluoroscopy. The hip was dislocated and the trial components removed. The final components were impacted in to position and the hip relocated. The final components were again check with fluoroscopy and found to be in good position. Hemostasis was achieved with electrocautery. The deep capsule was injected with Marcaine and epinephrine. The wound was irrigated with bacitracin laced normal saline and the tensor fascia closed with #2 Quill suture. The subcutaneous tissues were closed with 2-0 vicryl and staples for the skin. A sterile dressing was applied and an abduction pillow. Patient tolerated the procedure well and there were no apparent complication. Patient was taken to the recovery room in good condition.   Cassell Smiles, MD

## 2022-05-25 NOTE — Anesthesia Procedure Notes (Signed)
Spinal  Patient location during procedure: OR Start time: 05/25/2022 9:24 AM End time: 05/25/2022 9:27 AM Reason for block: surgical anesthesia Staffing Performed: resident/CRNA  Resident/CRNA: Aundria Rud, CRNA Performed by: Aundria Rud, CRNA Authorized by: Louie Boston, MD   Preanesthetic Checklist Completed: patient identified, IV checked, site marked, risks and benefits discussed, surgical consent, monitors and equipment checked, pre-op evaluation and timeout performed Spinal Block Patient position: sitting Prep: DuraPrep Patient monitoring: heart rate, cardiac monitor, continuous pulse ox and blood pressure Approach: midline Location: L3-4 Injection technique: single-shot Needle Needle type: Pencan  Needle gauge: 24 G Needle length: 10 cm Assessment Sensory level: T4 Events: CSF return Additional Notes Lidocaine 1% for skin wheel, 20G introducer used. + CSF.

## 2022-05-25 NOTE — H&P (Signed)
The patient has been re-examined, and the chart reviewed, and there have been no interval changes to the documented history and physical.  Plan a right total hip today.  Anesthesia is not consulted regarding a peripheral nerve block for post-operative pain.  The risks, benefits, and alternatives have been discussed at length, and the patient is willing to proceed.    

## 2022-05-26 DIAGNOSIS — M1611 Unilateral primary osteoarthritis, right hip: Secondary | ICD-10-CM | POA: Diagnosis not present

## 2022-05-26 LAB — GLUCOSE, CAPILLARY: Glucose-Capillary: 193 mg/dL — ABNORMAL HIGH (ref 70–99)

## 2022-05-26 MED ORDER — OXYCODONE HCL 5 MG PO TABS: 5.0000 mg | ORAL_TABLET | ORAL | 0 refills | Status: AC | PRN

## 2022-05-26 MED ORDER — RIVAROXABAN 10 MG PO TABS: 10.0000 mg | ORAL_TABLET | Freq: Every day | ORAL | 0 refills | Status: AC

## 2022-05-26 MED ORDER — DOCUSATE SODIUM 100 MG PO CAPS: 100.0000 mg | ORAL_CAPSULE | Freq: Two times a day (BID) | ORAL | 0 refills | Status: AC

## 2022-05-26 NOTE — Discharge Summary (Signed)
Physician Discharge Summary  Patient ID: Sarah Carson MRN: [id number] DOB/AGE: 15-Apr-1956 61 y.o.  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, right  Discharge Diagnoses:  M16.11 Unilateral primary osteoarthritis, right hip Principal Problem:   History of total hip replacement, right   Past Medical History:  Diagnosis Date   Anemia    Arthritis    Reumatiod   COPD (chronic obstructive pulmonary disease) (HCC)    Diabetes (HCC)    Dysrhythmia    GERD (gastroesophageal reflux disease)    Hypertension    Pneumonia    PONV (postoperative nausea and vomiting)     Surgeries: Procedure(s): TOTAL HIP ARTHROPLASTY ANTERIOR APPROACH on 05/25/2022   Consultants (if any):   Discharged Condition: Improved  Hospital Course: Sarah Carson is an 66 y.o. 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    She was given perioperative antibiotics:  Anti-infectives (From admission, onward)    Start     Dose/Rate Route Frequency Ordered Stop   05/25/22 0756  ceFAZolin (ANCEF) 2-4 GM/100ML-% IVPB       Note to Pharmacy: Christene Slates W: cabinet override      05/25/22 0756 05/25/22 0943   05/25/22 0600  ceFAZolin (ANCEF) IVPB 2g/100 mL premix        2 g 200 mL/hr over 30 Minutes Intravenous On call to O.R. 05/24/22 2208 05/25/22 0940     .  She was given sequential compression devices, early ambulation, and Xarelto daily for 10 days for DVT prophylaxis.  She benefited maximally from the hospital stay and there were no complications.    Recent vital signs:  Vitals:   05/25/22 1647 05/26/22 0052  BP: (!) 144/72 (!) 150/53  Pulse: 71 81  Resp: 16 20  Temp: 98 F (36.7 C) 99 F (37.2  C)  SpO2: 97% 96%    Recent laboratory studies:  Lab Results  Component Value Date   HGB 11.0 (L) 05/13/2022   HGB 10.6 (L) 04/02/2022   HGB 11.9 03/31/2021   Lab Results  Component Value Date   WBC 11.7 (H) 05/13/2022   PLT 257 05/13/2022   No results found for: "INR" Lab Results  Component Value Date   NA 139 05/13/2022   K 3.9 05/13/2022   CL 106 05/13/2022   CO2 22 05/13/2022   BUN 14 05/13/2022   CREATININE 0.59 05/13/2022   GLUCOSE 144 (H) 05/13/2022    Discharge Medications:   Allergies as of 05/26/2022       Reactions   Meloxicam Anaphylaxis   Acetaminophen Other (See Comments)   Restless leg   Canagliflozin    Other reaction(s): Other (See Comments) Pancreatitis   Darvon [propoxyphene]    Gabapentin    Muscle aches   Hydrocodone    Ketorolac Other (See Comments)   Vomiting, severe lower back pain/cramping and abdomen area.   Liraglutide    Metoprolol    Other reaction(s): Unknown   Toradol [ketorolac Tromethamine] Other (See Comments)   Vomiting, severe lower back pain/cramping and abdomen area.   Vioxx [rofecoxib] Other (See Comments)   Xopenex [levalbuterol]    Hydrocodone-acetaminophen Itching, Rash   Per patient   Hydrocodone-acetaminophen Itching, Rash   Per patient  Medication List     STOP taking these medications    oxycodone 5 MG capsule Commonly known as: OXY-IR Replaced by: oxyCODONE 5 MG immediate release tablet       TAKE these medications    docusate sodium 100 MG capsule Commonly known as: COLACE Take 1 capsule (100 mg total) by mouth 2 (two) times daily.   glucose blood test strip Commonly known as: ACCU-CHEK COMPACT STRIPS Check sugar once daily-need QS 30 day supply. DX E11.9   Lancet Device Misc One Touch Ultra Mini Lancets   metFORMIN 1000 MG tablet Commonly known as: GLUCOPHAGE TAKE 1 TABLET (1,000 MG TOTAL) BY MOUTH IN THE MORNING AND AT BEDTIME   omeprazole 40 MG capsule Commonly known as:  PRILOSEC TAKE 1 CAPSULE BY MOUTH EVERY DAY   OneTouch Delica Plus Lancet33G Misc See admin instructions.   oxyCODONE 5 MG immediate release tablet Commonly known as: Oxy IR/ROXICODONE Take 1-2 tablets (5-10 mg total) by mouth every 4 (four) hours as needed for up to 7 days for moderate pain (pain). Replaces: oxycodone 5 MG capsule   pioglitazone 30 MG tablet Commonly known as: ACTOS TAKE 1 TABLET BY MOUTH EVERY DAY   rivaroxaban 10 MG Tabs tablet Commonly known as: XARELTO Take 1 tablet (10 mg total) by mouth daily with breakfast.   valsartan 40 MG tablet Commonly known as: DIOVAN TAKE 1 TABLET BY MOUTH EVERY DAY   Verapamil HCl CR 300 MG Cp24 TAKE 1 CAPSULE BY MOUTH EVERY DAY               Durable Medical Equipment  (From admission, onward)           Start     Ordered   05/26/22 0629  For home use only DME 3 n 1  Once        05/26/22 0347   05/26/22 0629  For home use only DME Walker rolling  Once       Question Answer Comment  Walker: With 5 Inch Wheels   Patient needs a walker to treat with the following condition Osteoarthritis of right hip      05/26/22 0628            Diagnostic Studies: DG HIP UNILAT WITH PELVIS 1V RIGHT  Result Date: 05/25/2022 CLINICAL DATA:  Status post right total hip arthroplasty. EXAM: DG HIP (WITH OR WITHOUT PELVIS) 1V RIGHT COMPARISON:  04/09/2020 FINDINGS: Postop change from right total hip arthroplasty noted. Hardware components are in anatomic alignment. No periprosthetic fracture or subluxation identified. Visualized osseous structures are unremarkable. IMPRESSION: Status post right total hip arthroplasty. Electronically Signed   By: Signa Kell M.D.   On: 05/25/2022 10:54   DG C-Arm 1-60 Min-No Report  Result Date: 05/25/2022 Fluoroscopy was utilized by the requesting physician.  No radiographic interpretation.   DG C-Arm 1-60 Min-No Report  Result Date: 05/25/2022 Fluoroscopy was utilized by the requesting  physician.  No radiographic interpretation.    Disposition: Discharge disposition: 01-Home or Self Care            Signed: Altamese Cabal ,PA-C 05/26/2022, 6:29 AM

## 2022-05-26 NOTE — Discharge Instructions (Signed)

## 2022-05-26 NOTE — Progress Notes (Signed)
Met with the patient and discussed DC planning and needs She has a sister that will help her, her son will also help, Her son provides transportation She has a rolling walker and a BSC as well as a Raised toilet at home and does not need additional DME [facility] accepted the patient for [facility] and will call to get set up

## 2022-05-26 NOTE — Progress Notes (Signed)
Physical Therapy Treatment Patient Details Name: Sarah Carson MRN: [id number] DOB: September 23, 1955 Today's Date: 05/26/2022   History of Present Illness Pt is a 66 yo female s/p R THA. PMH of COPD, HTN, anxiety, depression, DMII, RA.    PT Comments    Patient alert agreeable to PT, reported 7/10 R hip pain with movement. Supine to sit with supervision ,extra time and bed rails. Stand pivot to [facility] with RW and supervision. The patient was able to don briefs with minA in standing. She ambulated ~33ft total with RW and CGA, close chair follow. Pt still limited by fatigue, nausea and dizziness, but better than this AM session. PT and pt discussed discharge plans, and pt explained she has hired aides to assist nearly 24/7. The patient would benefit from further skilled PT intervention to continue to progress towards goals. Recommendation remains appropriate.       Recommendations for follow up therapy are one component of a multi-disciplinary discharge planning process, led by the attending physician.  Recommendations may be updated based on patient status, additional functional criteria and insurance authorization.  Follow Up Recommendations  Home health PT     Assistance Recommended at Discharge Frequent or constant Supervision/Assistance  Patient can return home with the following A little help with walking and/or transfers;Assistance with cooking/housework;A little help with bathing/dressing/bathroom;Assist for transportation;Assistance with feeding;Help with stairs or ramp for entrance   Equipment Recommendations  Rolling walker (2 wheels);BSC/3in1    Recommendations for Other Services       Precautions / Restrictions Precautions Precautions: Fall;Anterior Hip Precaution Booklet Issued: Yes (comment) Restrictions Weight Bearing Restrictions: Yes RLE Weight Bearing: Weight bearing as tolerated     Mobility  Bed Mobility Overal bed mobility: Needs Assistance Bed Mobility: Supine to  Sit     Supine to sit: Supervision, HOB elevated     General bed mobility comments: use of bed rails    Transfers Overall transfer level: Needs assistance Equipment used: Rolling walker (2 wheels) Transfers: Sit to/from Stand Sit to Stand: Min guard           General transfer comment: extended time. reliant on BUE suport to complete    Ambulation/Gait Ambulation/Gait assistance: Min guard Gait Distance (Feet): 65 Feet Assistive device: Rolling walker (2 wheels)         General Gait Details: slow, step to gait pattern, chair follow for safety did endorse continued dizziness   Stairs             Wheelchair Mobility    Modified Rankin (Stroke Patients Only)       Balance Overall balance assessment: Needs assistance Sitting-balance support: Feet supported Sitting balance-Leahy Scale: Good     Standing balance support: Single extremity supported, During functional activity Standing balance-Leahy Scale: Fair Standing balance comment: able to don briefs and pants with minA in standing                            Cognition Arousal/Alertness: Awake/alert Behavior During Therapy: WFL for tasks assessed/performed Overall Cognitive Status: Within Functional Limits for tasks assessed                                          Exercises Other Exercises Other Exercises: pericare in standing with CGA, reliant on at least unilateral support at all times on RW  General Comments        Pertinent Vitals/Pain Pain Assessment Pain Assessment: 0-10 Pain Score: 7  Pain Location: R hip Pain Descriptors / Indicators: Aching, Sore, Guarding, Moaning, Grimacing Pain Intervention(s): Limited activity within patient's tolerance, Monitored during session, Repositioned, Premedicated before session    Home Living                          Prior Function            PT Goals (current goals can now be found in the care plan  section) Progress towards PT goals: Progressing toward goals    Frequency    BID      PT Plan Current plan remains appropriate    Co-evaluation              AM-PAC PT "6 Clicks" Mobility   Outcome Measure  Help needed turning from your back to your side while in a flat bed without using bedrails?: A Little Help needed moving from lying on your back to sitting on the side of a flat bed without using bedrails?: A Little Help needed moving to and from a bed to a chair (including a wheelchair)?: A Little Help needed standing up from a chair using your arms (e.g., wheelchair or bedside chair)?: A Little Help needed to walk in hospital room?: A Little Help needed climbing 3-5 steps with a railing? : A Little 6 Click Score: 18    End of Session Equipment Utilized During Treatment: Gait belt Activity Tolerance: Patient tolerated treatment well Patient left: with call bell/phone within reach;with family/visitor present;in chair Nurse Communication: Mobility status PT Visit Diagnosis: Other abnormalities of gait and mobility (R26.89);Difficulty in walking, not elsewhere classified (R26.2);Muscle weakness (generalized) (M62.81)     Time: 4098-1191 PT Time Calculation (min) (ACUTE ONLY): 19 min  Charges:  $Therapeutic Activity: 8-22 mins                     Olga Coaster PT, DPT 4:12 PM,05/26/22

## 2022-05-26 NOTE — Progress Notes (Signed)
Physical Therapy Treatment Patient Details Name: Sarah Carson MRN: [id number] DOB: Jun 04, 1956 Today's Date: 05/26/2022   History of Present Illness Pt is a 66 yo female s/p R THA. PMH of COPD, HTN, anxiety, depression, DMII, RA.    PT Comments    Patient alert, agreeable to PT. Reported 3/10 R hip pain at rest, 6-7/10 pain with ambulation, pt did have pain medication prior to session. Sit <> stand from recliner and from [facility], CGA but extended time needed and pt very reliant on BUE support. She ambulated 1ft to [facility] in room, and then additional 15ft to the door with chair follow, CGA, and RW. Very slow, antalgic gait, several standing rest breaks needed. Nausea, dizziness, and pain limited further ambulation. Returned to recliner with all needs in reach at end of session. The patient would benefit from further skilled PT intervention to continue to progress towards goals. Recommendation remains appropriate.       Recommendations for follow up therapy are one component of a multi-disciplinary discharge planning process, led by the attending physician.  Recommendations may be updated based on patient status, additional functional criteria and insurance authorization.  Follow Up Recommendations  Home health PT     Assistance Recommended at Discharge Frequent or constant Supervision/Assistance  Patient can return home with the following A little help with walking and/or transfers;Assistance with cooking/housework;A little help with bathing/dressing/bathroom;Assist for transportation;Assistance with feeding;Help with stairs or ramp for entrance   Equipment Recommendations  Rolling walker (2 wheels);BSC/3in1    Recommendations for Other Services       Precautions / Restrictions Precautions Precautions: Fall;Anterior Hip Restrictions Weight Bearing Restrictions: Yes RLE Weight Bearing: Weight bearing as tolerated     Mobility  Bed Mobility               General bed mobility  comments: pt up in chair at start/end of session    Transfers Overall transfer level: Needs assistance Equipment used: Rolling walker (2 wheels) Transfers: Sit to/from Stand Sit to Stand: Min guard           General transfer comment: extended time. reliant on BUE suport to complete    Ambulation/Gait   Gait Distance (Feet): 25 Feet Assistive device: Rolling walker (2 wheels)         General Gait Details: 59ft to [facility] in room, and then additional 45ft to the door with chair follow. nausea, dizziness, and pain limited further ambulation.   Stairs             Wheelchair Mobility    Modified Rankin (Stroke Patients Only)       Balance Overall balance assessment: Needs assistance Sitting-balance support: Feet supported Sitting balance-Leahy Scale: Good     Standing balance support: Single extremity supported, Reliant on assistive device for balance Standing balance-Leahy Scale: Fair                              Cognition Arousal/Alertness: Awake/alert Behavior During Therapy: WFL for tasks assessed/performed Overall Cognitive Status: Within Functional Limits for tasks assessed                                          Exercises Other Exercises Other Exercises: pericare in standing with CGA, reliant on at least unilateral support at all times on RW    General Comments  Pertinent Vitals/Pain Pain Assessment Pain Assessment: 0-10 Pain Score: 7  Pain Location: R hip Pain Descriptors / Indicators: Aching, Sore, Guarding, Moaning, Grimacing Pain Intervention(s): Limited activity within patient's tolerance, Monitored during session, Repositioned, Premedicated before session    Home Living                          Prior Function            PT Goals (current goals can now be found in the care plan section) Progress towards PT goals: Progressing toward goals    Frequency    BID      PT Plan  Current plan remains appropriate    Co-evaluation              AM-PAC PT "6 Clicks" Mobility   Outcome Measure  Help needed turning from your back to your side while in a flat bed without using bedrails?: A Little Help needed moving from lying on your back to sitting on the side of a flat bed without using bedrails?: A Little Help needed moving to and from a bed to a chair (including a wheelchair)?: A Little Help needed standing up from a chair using your arms (e.g., wheelchair or bedside chair)?: A Little Help needed to walk in hospital room?: A Little Help needed climbing 3-5 steps with a railing? : A Little 6 Click Score: 18    End of Session Equipment Utilized During Treatment: Gait belt Activity Tolerance: Patient tolerated treatment well Patient left: in bed;with call bell/phone within reach;with bed alarm set;with nursing/sitter in room;with family/visitor present Nurse Communication: Mobility status PT Visit Diagnosis: Other abnormalities of gait and mobility (R26.89);Difficulty in walking, not elsewhere classified (R26.2);Muscle weakness (generalized) (M62.81)     Time: 5366-4403 PT Time Calculation (min) (ACUTE ONLY): 24 min  Charges:  $Therapeutic Activity: 23-37 mins                     Olga Coaster PT, DPT 10:36 AM,05/26/22

## 2022-05-26 NOTE — Progress Notes (Signed)
  Subjective:  Patient reports pain as mild.    Objective:   VITALS:   Vitals:   05/25/22 1245 05/25/22 1308 05/25/22 1647 05/26/22 0052  BP: 106/60 135/74 (!) 144/72 (!) 150/53  Pulse: 61 64 71 81  Resp: $Remo'13 16 16 20  'rZdgZ$ Temp: 97.6 F (36.4 C) 98.1 F (36.7 C) 98 F (36.7 C) 99 F (37.2 C)  TempSrc:      SpO2: 98% 100% 97% 96%  Weight:      Height:        PHYSICAL EXAM:  Neurologically intact ABD soft Neurovascular intact Sensation intact distally Intact pulses distally Dorsiflexion/Plantar flexion intact Incision: dressing C/D/I No cellulitis present Compartment soft  LABS  Results for orders placed or performed during the hospital encounter of 05/25/22 (from the past 24 hour(s))  Glucose, capillary     Status: Abnormal   Collection Time: 05/25/22  7:45 AM  Result Value Ref Range   Glucose-Capillary 167 (H) 70 - 99 mg/dL  Glucose, capillary     Status: Abnormal   Collection Time: 05/25/22 11:13 AM  Result Value Ref Range   Glucose-Capillary 168 (H) 70 - 99 mg/dL   Comment 1 Notify RN    Comment 2 Document in Chart     DG HIP UNILAT WITH PELVIS 1V RIGHT  Result Date: 05/25/2022 CLINICAL DATA:  Status post right total hip arthroplasty. EXAM: DG HIP (WITH OR WITHOUT PELVIS) 1V RIGHT COMPARISON:  04/09/2020 FINDINGS: Postop change from right total hip arthroplasty noted. Hardware components are in anatomic alignment. No periprosthetic fracture or subluxation identified. Visualized osseous structures are unremarkable. IMPRESSION: Status post right total hip arthroplasty. Electronically Signed   By: Kerby Moors M.D.   On: 05/25/2022 10:54   DG C-Arm 1-60 Min-No Report  Result Date: 05/25/2022 Fluoroscopy was utilized by the requesting physician.  No radiographic interpretation.   DG C-Arm 1-60 Min-No Report  Result Date: 05/25/2022 Fluoroscopy was utilized by the requesting physician.  No radiographic interpretation.    Assessment/Plan: 1 Day Post-Op    Principal Problem:   History of total hip replacement, right   Advance diet Up with therapy D/C home today if PT Goals met   Carlynn Spry , PA-C 05/26/2022, 6:20 AM

## 2022-05-26 NOTE — Evaluation (Signed)
Occupational Therapy Evaluation Patient Details Name: Sarah Carson MRN: [id number] DOB: 08-07-1956 Today's Date: 05/26/2022   History of Present Illness Pt is a 66 yo female s/p R THA. PMH of COPD, HTN, anxiety, depression, DMII, RA.   Clinical Impression   [name] was seen for OT evaluation this date. Prior to hospital admission, pt was MOD I for mobility and ADLs. Pt lives alone, plan for family to assist. Pt presents to acute OT demonstrating impaired ADL performance and functional mobility 2/2 decreased activity tolerance and functional strength/ROM/balance deficits. Pt currently requires MOD A don socks in sitting. SETUP + SUPERVISION grooming in sitting - unable to tolerate in standing. CGA + RW for simulated toilet t/f, ~15 ft. Educated on adapted dressing/bathing strategies and falls prevention. Pt would benefit from skilled OT to address noted impairments and functional limitations (see below for any additional details). Upon hospital discharge, recommend no OT follow up.   Recommendations for follow up therapy are one component of a multi-disciplinary discharge planning process, led by the attending physician.  Recommendations may be updated based on patient status, additional functional criteria and insurance authorization.   Follow Up Recommendations  No OT follow up    Assistance Recommended at Discharge Frequent or constant Supervision/Assistance  Patient can return home with the following A little help with walking and/or transfers;A little help with bathing/dressing/bathroom    Functional Status Assessment  Patient has had a recent decline in their functional status and demonstrates the ability to make significant improvements in function in a reasonable and predictable amount of time.  Equipment Recommendations  None recommended by OT    Recommendations for Other Services       Precautions / Restrictions Precautions Precautions: Fall;Anterior  Hip Restrictions Weight Bearing Restrictions: Yes RLE Weight Bearing: Weight bearing as tolerated      Mobility Bed Mobility Overal bed mobility: Needs Assistance Bed Mobility: Supine to Sit     Supine to sit: Min assist          Transfers Overall transfer level: Needs assistance Equipment used: Rolling walker (2 wheels) Transfers: Sit to/from Stand Sit to Stand: Min guard           General transfer comment: cues for hand placement      Balance Overall balance assessment: Needs assistance Sitting-balance support: Feet supported Sitting balance-Leahy Scale: Good     Standing balance support: Single extremity supported, During functional activity Standing balance-Leahy Scale: Fair                             ADL either performed or assessed with clinical judgement   ADL Overall ADL's : Needs assistance/impaired                                       General ADL Comments: MOD A don socks in sitting. SETUP + SUPERVISION grooming in sitting - unable to tolerate in standing. CGA + RW for simulated toilet t/f      Pertinent Vitals/Pain Pain Assessment Pain Assessment: 0-10 Pain Score: 7  Pain Location: R hip Pain Descriptors / Indicators: Aching, Sore, Guarding, Moaning, Grimacing Pain Intervention(s): Limited activity within patient's tolerance, Patient requesting pain meds-RN notified, RN gave pain meds during session     Hand Dominance     Extremity/Trunk Assessment Upper Extremity Assessment Upper Extremity Assessment: Overall WFL for tasks  assessed   Lower Extremity Assessment Lower Extremity Assessment: Generalized weakness       Communication Communication Communication: No difficulties   Cognition Arousal/Alertness: Awake/alert Behavior During Therapy: WFL for tasks assessed/performed Overall Cognitive Status: Within Functional Limits for tasks assessed                                         Home Living Family/patient expects to be discharged to:: Private residence Living Arrangements: Alone Available Help at Discharge: Family Type of Home: House Home Access: Ramped entrance     Home Layout: One level               Home Equipment: Agricultural consultant (2 wheels);Rollator (4 wheels);BSC/3in1          Prior Functioning/Environment Prior Level of Function : Independent/Modified Independent;Driving                        OT Problem List: Decreased range of motion;Decreased activity tolerance;Impaired balance (sitting and/or standing);Decreased strength      OT Treatment/Interventions: Self-care/ADL training;Therapeutic exercise;Energy conservation;DME and/or AE instruction;Therapeutic activities;Patient/family education;Balance training    OT Goals(Current goals can be found in the care plan section) Acute Rehab OT Goals Patient Stated Goal: to go home OT Goal Formulation: With patient Time For Goal Achievement: 06/09/22 Potential to Achieve Goals: Good ADL Goals Pt Will Perform Grooming: with modified independence;standing Pt Will Perform Lower Body Dressing: with modified independence;sit to/from stand Pt Will Transfer to Toilet: with modified independence;ambulating;regular height toilet  OT Frequency: Min 2X/week    Co-evaluation              AM-PAC OT "6 Clicks" Daily Activity     Outcome Measure Help from another person eating meals?: None Help from another person taking care of personal grooming?: A Little Help from another person toileting, which includes using toliet, bedpan, or urinal?: A Little Help from another person bathing (including washing, rinsing, drying)?: A Little Help from another person to put on and taking off regular upper body clothing?: None Help from another person to put on and taking off regular lower body clothing?: A Lot 6 Click Score: 19   End of Session Equipment Utilized During Treatment: Rolling walker (2  wheels) Nurse Communication: Patient requests pain meds  Activity Tolerance: Patient tolerated treatment well Patient left: in chair;with call bell/phone within reach;with family/visitor present  OT Visit Diagnosis: Muscle weakness (generalized) (M62.81)                Time: 8341-9622 OT Time Calculation (min): 36 min Charges:  OT General Charges $OT Visit: 1 Visit OT Evaluation $OT Eval Moderate Complexity: 1 Mod OT Treatments $Self Care/Home Management : 23-37 mins  Kathie Dike, M.S. OTR/L  05/26/22, 12:58 PM  ascom [phone]

## 2022-05-26 NOTE — Plan of Care (Signed)
Patient discharged per MD orders at this time.All discharge instructions,education & medications reviewed with the patient.Pt expressed understanding and will comply with dc instructions.follow up appointments was also communicated to the patient.no verbal c/o or any ssx of distress.Pt was discharged home with HH/PT/OT services per order.Pt was transported home by son in a privately owned vehicle. 

## 2022-05-27 LAB — SURGICAL PATHOLOGY

## 2022-06-02 ENCOUNTER — Encounter (HOSPITAL_COMMUNITY): Payer: Self-pay

## 2022-06-02 ENCOUNTER — Other Ambulatory Visit: Payer: Self-pay

## 2022-06-02 ENCOUNTER — Emergency Department (HOSPITAL_COMMUNITY)
Admission: EM | Admit: 2022-06-02 | Discharge: 2022-06-02 | Disposition: A | Discharge status: Home / Self Care | Payer: Medicare HMO | Attending: Emergency Medicine | Admitting: Emergency Medicine

## 2022-06-02 ENCOUNTER — Emergency Department (HOSPITAL_COMMUNITY): Payer: Medicare HMO

## 2022-06-02 DIAGNOSIS — I10 Essential (primary) hypertension: Secondary | ICD-10-CM | POA: Insufficient documentation

## 2022-06-02 DIAGNOSIS — Z7984 Long term (current) use of oral hypoglycemic drugs: Secondary | ICD-10-CM | POA: Insufficient documentation

## 2022-06-02 DIAGNOSIS — E119 Type 2 diabetes mellitus without complications: Secondary | ICD-10-CM | POA: Diagnosis not present

## 2022-06-02 DIAGNOSIS — Z96641 Presence of right artificial hip joint: Secondary | ICD-10-CM | POA: Diagnosis not present

## 2022-06-02 DIAGNOSIS — Z7901 Long term (current) use of anticoagulants: Secondary | ICD-10-CM | POA: Diagnosis not present

## 2022-06-02 DIAGNOSIS — Z79899 Other long term (current) drug therapy: Secondary | ICD-10-CM | POA: Diagnosis not present

## 2022-06-02 DIAGNOSIS — M79671 Pain in right foot: Secondary | ICD-10-CM | POA: Diagnosis not present

## 2022-06-02 DIAGNOSIS — M25571 Pain in right ankle and joints of right foot: Secondary | ICD-10-CM | POA: Insufficient documentation

## 2022-06-02 NOTE — ED Provider Notes (Signed)
[facility] EMERGENCY DEPARTMENT Provider Note   CSN: [id number] Arrival date & time: 06/02/22  0205     History  Chief Complaint  Patient presents with   Foot Pain    Sarah Carson is a 66 y.o. female.  Patient is a 66 year old female with past medical history of osteoarthritis status post right total hip replacement within the past week.  She also has a history of type 2 diabetes, hypertension, hyperlipidemia.  Patient presenting today with complaints of right foot and ankle pain.  This apparently started this evening after doing her postop exercises.  She denies any specific injury or trauma.  She describes severe pain and cramping to the calf and proximal foot.  This is worse when she attempts to bear weight or move.  She denies any fevers or chills.  She denies history of gout.  She was prescribed Xarelto and has been on this since her surgery was performed.  She denies any chest pain or difficulty breathing.  The history is provided by the patient.       Home Medications Prior to Admission medications   Medication Sig Start Date End Date Taking? Authorizing Provider  docusate sodium (COLACE) 100 MG capsule Take 1 capsule (100 mg total) by mouth 2 (two) times daily. 05/26/22   Carlynn Spry, PA-C  glucose blood (ACCU-CHEK COMPACT STRIPS) test strip Check sugar once daily-need QS 30 day supply. DX E11.9 04/04/15   Jerrol Banana., MD  Lancet Device MISC One Touch Ultra Mini Lancets 03/26/21   Jerrol Banana., MD  Lancets (ONETOUCH DELICA PLUS [id number]) [place] See admin instructions. 06/21/21   [provider]  metFORMIN (GLUCOPHAGE) 1000 MG tablet TAKE 1 TABLET (1,000 MG TOTAL) BY MOUTH IN THE MORNING AND AT BEDTIME 03/10/22   Jerrol Banana., MD  omeprazole (PRILOSEC) 40 MG capsule TAKE 1 CAPSULE BY MOUTH EVERY DAY 05/24/22   Jerrol Banana., MD  oxyCODONE (OXY IR/ROXICODONE) 5 MG immediate release tablet Take 1-2 tablets (5-10 mg total) by mouth  every 4 (four) hours as needed for up to 7 days for moderate pain (pain). 05/26/22 06/02/22  Carlynn Spry, PA-C  pioglitazone (ACTOS) 30 MG tablet TAKE 1 TABLET BY MOUTH EVERY DAY 05/11/22   Jerrol Banana., MD  rivaroxaban (XARELTO) 10 MG TABS tablet Take 1 tablet (10 mg total) by mouth daily with breakfast. 05/26/22   Carlynn Spry, PA-C  valsartan (DIOVAN) 40 MG tablet TAKE 1 TABLET BY MOUTH EVERY DAY 12/09/21   Jerrol Banana., MD  Verapamil HCl CR 300 MG CP24 TAKE 1 CAPSULE BY MOUTH EVERY DAY 05/24/22   Jerrol Banana., MD      Allergies    Meloxicam, Acetaminophen, Canagliflozin, Darvon [propoxyphene], Gabapentin, Hydrocodone, Ketorolac, Liraglutide, Metoprolol, Toradol [ketorolac tromethamine], Vioxx [rofecoxib], Xopenex [levalbuterol], Hydrocodone-acetaminophen, and Hydrocodone-acetaminophen    Review of Systems   Review of Systems  All other systems reviewed and are negative.   Physical Exam Updated Vital Signs BP (!) 142/61   Pulse 79   Temp 98.7 F (37.1 C) (Oral)   Resp 18   Ht 5\' 5"  (1.651 m)   Wt 92.1 kg   SpO2 97%   BMI 33.78 kg/m  Physical Exam Vitals and nursing note reviewed.  Constitutional:      General: She is not in acute distress.    Appearance: Normal appearance. She is not ill-appearing.  HENT:     Head: Normocephalic and atraumatic.  Pulmonary:     Effort: Pulmonary effort is normal.  Musculoskeletal:     Comments: The right lower extremity is grossly normal in appearance.  There is no significant swelling, erythema, or obvious abnormality.  The surgical dressing is in place and there is no apparent bleeding or purulent drainage.  The right ankle is grossly normal in appearance, but there is tenderness with palpation to the medial aspect of the ankle.  Skin:    General: Skin is warm and dry.  Neurological:     Mental Status: She is alert and oriented to person, place, and time.     ED Results / Procedures / Treatments    Labs (all labs ordered are listed, but only abnormal results are displayed) Labs Reviewed - No data to display  EKG None  Radiology No results found.  Procedures Procedures    Medications Ordered in ED Medications - No data to display  ED Course/ Medical Decision Making/ A&P  Patient with recent right total hip replacement presenting with complaints of right ankle and posterior leg pain.  This began this evening in the absence of any injury or trauma.  She describes spasms and cramps from the top of the foot up the back of the ankle.  Their physical examination is unremarkable.  I see no significant swelling.  She has easily palpable DP and PT pulses.  There is no calf tenderness and I highly doubt DVT.  She is currently taking Xarelto for DVT prophylaxis.  X-rays show degenerative changes, but no obvious findings otherwise.  At this point, I feel as though patient can safely be discharged with follow-up with her orthopedist.  Final Clinical Impression(s) / ED Diagnoses Final diagnoses:  None    Rx / DC Orders ED Discharge Orders     None         Veryl Speak, MD 06/02/22 [phone]

## 2022-06-02 NOTE — ED Triage Notes (Signed)
Pt complaining of right leg/ankle pain that started last night. Pt had right hip replacement last Tuesday. Pt is on blood thinner. Pt states it hurts to the touch and that she can not longer bear weight on it.

## 2022-06-02 NOTE — Discharge Instructions (Signed)
Continue medications as previously prescribed.  Rest.  Follow-up with your orthopedist if symptoms are not improving in the next 2 to 3 days.

## 2022-06-02 NOTE — ED Notes (Signed)
Patient verbalizes understanding of discharge instructions. Opportunity for questioning and answers were provided. Armband removed by staff, pt discharged from ED. Pt wheeled out to drop-off, where her son will pick her up for discharge

## 2022-06-17 ENCOUNTER — Other Ambulatory Visit: Payer: Self-pay | Admitting: Family Medicine

## 2022-06-17 DIAGNOSIS — E119 Type 2 diabetes mellitus without complications: Secondary | ICD-10-CM

## 2022-07-05 ENCOUNTER — Telehealth: Payer: Self-pay

## 2022-07-05 NOTE — Telephone Encounter (Signed)
Copied from [place] [id number]. Topic: General - Other >> Jul 05, 2022 10:03 AM Ludger Nutting wrote: Patient is asking if a medical records release form be mailed to her to complete. Please follow up with patient.

## 2022-07-22 ENCOUNTER — Other Ambulatory Visit: Payer: Medicare HMO

## 2022-07-29 ENCOUNTER — Telehealth: Payer: Self-pay | Admitting: Family Medicine

## 2022-07-29 DIAGNOSIS — I10 Essential (primary) hypertension: Secondary | ICD-10-CM

## 2022-07-29 MED ORDER — VALSARTAN 40 MG PO TABS: 40.0000 mg | ORAL_TABLET | Freq: Every day | ORAL | 1 refills | Status: DC

## 2022-07-29 NOTE — Telephone Encounter (Signed)
[facility] faxed refill request for the following medications:   valsartan (DIOVAN) 40 MG tablet    Please advise.

## 2022-08-31 LAB — HM DIABETES EYE EXAM

## 2022-09-03 ENCOUNTER — Encounter: Payer: Self-pay | Admitting: Family Medicine

## 2022-09-12 ENCOUNTER — Other Ambulatory Visit: Payer: Self-pay | Admitting: Physician Assistant

## 2022-09-12 DIAGNOSIS — E119 Type 2 diabetes mellitus without complications: Secondary | ICD-10-CM

## 2022-09-14 NOTE — Telephone Encounter (Signed)
Requested medication (s) are due for refill today: yes  Requested medication (s) are on the active medication list: yes  Last refill:  06/17/22  Future visit scheduled: yes  Notes to clinic:  Unable to refill per protocol, last refill by provider no longer at practice.     Requested Prescriptions  Pending Prescriptions Disp Refills   metFORMIN (GLUCOPHAGE) 1000 MG tablet [Pharmacy Med Name: METFORMIN HCL 1,000 MG TABLET] 180 tablet 0    Sig: TAKE 1 TABLET (1,000 MG TOTAL) BY MOUTH IN THE MORNING AND AT BEDTIME     Endocrinology:  Diabetes - Biguanides Failed - 09/12/2022  9:18 AM      Failed - HBA1C is between 0 and 7.9 and within 180 days    Hgb A1c MFr Bld  Date Value Ref Range Status  04/02/2022 9.4 (H) 4.8 - 5.6 % Final    Comment:             Prediabetes: 5.7 - 6.4          Diabetes: >6.4          Glycemic control for adults with diabetes: <7.0          Failed - B12 Level in normal range and within 720 days    No results found for: "VITAMINB12"       Passed - Cr in normal range and within 360 days    Creatinine, Ser  Date Value Ref Range Status  05/13/2022 0.59 0.44 - 1.00 mg/dL Final         Passed - eGFR in normal range and within 360 days    GFR calc Af Amer  Date Value Ref Range Status  11/21/2019 105 >59 mL/min/1.73 Final   GFR, Estimated  Date Value Ref Range Status  05/13/2022 >60 >60 mL/min Final    Comment:    (NOTE) Calculated using the CKD-EPI Creatinine Equation (2021)    eGFR  Date Value Ref Range Status  04/02/2022 97 >59 mL/min/1.73 Final         Passed - Valid encounter within last 6 months    Recent Outpatient Visits           5 months ago Annual physical exam   [facility] Jerrol Banana., MD   9 months ago Type 2 diabetes mellitus without complication, without long-term current use of insulin ([place])   [facility] Jerrol Banana., MD   1 year ago Preop examination   [facility] Jerrol Banana., MD   1 year ago Type 2 diabetes mellitus without complication, without long-term current use of insulin [facility])   [facility] Jerrol Banana., MD   2 years ago Type 2 diabetes mellitus without complication, without long-term current use of insulin [facility])   [facility] Jerrol Banana., MD              Passed - CBC within normal limits and completed in the last 12 months    WBC  Date Value Ref Range Status  05/13/2022 11.7 (H) 4.0 - 10.5 K/uL Final   RBC  Date Value Ref Range Status  05/13/2022 4.22 3.87 - 5.11 MIL/uL Final   Hemoglobin  Date Value Ref Range Status  05/13/2022 11.0 (L) 12.0 - 15.0 g/dL Final  04/02/2022 10.6 (L) 11.1 - 15.9 g/dL Final   HCT  Date Value Ref Range Status  05/13/2022 34.4 (L) 36.0 - 46.0 % Final  Hematocrit  Date Value Ref Range Status  04/02/2022 32.9 (L) 34.0 - 46.6 % Final   MCHC  Date Value Ref Range Status  05/13/2022 32.0 30.0 - 36.0 g/dL Final   [facility]  Date Value Ref Range Status  05/13/2022 26.1 26.0 - 34.0 pg Final   MCV  Date Value Ref Range Status  05/13/2022 81.5 80.0 - 100.0 fL Final  04/02/2022 80 79 - 97 fL Final   No results found for: "PLTCOUNTKUC", "LABPLAT", "POCPLA" RDW  Date Value Ref Range Status  05/13/2022 14.9 11.5 - 15.5 % Final  04/02/2022 15.4 11.7 - 15.4 % Final

## 2022-09-21 ENCOUNTER — Ambulatory Visit: Payer: Medicare HMO | Admitting: Family Medicine

## 2022-10-25 ENCOUNTER — Other Ambulatory Visit: Payer: Self-pay | Admitting: *Deleted

## 2022-10-25 ENCOUNTER — Other Ambulatory Visit: Payer: Self-pay | Admitting: Physician Assistant

## 2022-10-25 ENCOUNTER — Telehealth: Payer: Self-pay | Admitting: Physician Assistant

## 2022-10-25 DIAGNOSIS — E119 Type 2 diabetes mellitus without complications: Secondary | ICD-10-CM

## 2022-10-25 MED ORDER — OMEPRAZOLE 40 MG PO CPDR: DELAYED_RELEASE_CAPSULE | ORAL | 0 refills | Status: AC

## 2022-10-25 MED ORDER — PIOGLITAZONE HCL 30 MG PO TABS: 30.0000 mg | ORAL_TABLET | Freq: Every day | ORAL | 0 refills | Status: AC

## 2022-10-25 MED ORDER — VERAPAMIL HCL ER 300 MG PO CP24: 300.0000 mg | ORAL_CAPSULE | Freq: Every day | ORAL | 0 refills | Status: AC

## 2022-10-25 NOTE — Telephone Encounter (Signed)
Sent courtesy 30 days needs appt

## 2022-10-25 NOTE — Telephone Encounter (Signed)
[facility] requesting refill Verapamil HCl CR 300 MG CP24  Please advise

## 2022-10-25 NOTE — Telephone Encounter (Signed)
Sent medication  to [facility] pharmacy

## 2022-10-25 NOTE — Telephone Encounter (Signed)
Sent courtesy needs appt

## 2022-10-25 NOTE — Telephone Encounter (Signed)
Please advise 

## 2022-10-25 NOTE — Telephone Encounter (Signed)
[facility] requesting refill omeprazole (PRILOSEC) 40 MG capsule  Please advise

## 2022-10-25 NOTE — Telephone Encounter (Signed)
[facility] requesting refills pioglitazone (ACTOS) 30 MG tablet  Please advise

## 2022-10-25 NOTE — Telephone Encounter (Signed)
Last refill 05/11/22 by Dr. Rosanna Randy. Please advise

## 2022-11-22 ENCOUNTER — Other Ambulatory Visit: Payer: Self-pay | Admitting: Physician Assistant

## 2022-11-22 DIAGNOSIS — E119 Type 2 diabetes mellitus without complications: Secondary | ICD-10-CM

## 2022-11-23 NOTE — Telephone Encounter (Signed)
Attempted to call and schedule her for yearly physical and establish her with another provider since Dr. Rosanna Randy is no longer with the practice. Left a voicemail to call back and schedule her physical so we could continue refilling her medications.  30 day courtesy refills were given in Feb. 2024.  Refill requests sent to [facility].

## 2022-12-03 ENCOUNTER — Telehealth: Payer: Self-pay | Admitting: Family Medicine

## 2022-12-03 NOTE — Telephone Encounter (Signed)
Followed up with patient to see if she was going to continue to see Dr. Rosanna Randy or Mikey Kirschner as PCP. Patient stated that she would continue with Dr. Rosanna Randy.

## 2022-12-10 ENCOUNTER — Other Ambulatory Visit: Payer: Self-pay | Admitting: Family Medicine

## 2022-12-10 DIAGNOSIS — E119 Type 2 diabetes mellitus without complications: Secondary | ICD-10-CM

## 2022-12-10 NOTE — Telephone Encounter (Signed)
Requested medications are due for refill today.  yes  Requested medications are on the active medications list.  yes  Last refill. 09/2022 #90  Future visit scheduled.   no  Notes to clinic.  Dr. Rosanna Randy pt. Per phone call 12/03/2022 pt will continue with Dr. Rosanna Randy.    Requested Prescriptions  Pending Prescriptions Disp Refills   metFORMIN (GLUCOPHAGE) 1000 MG tablet [Pharmacy Med Name: METFORMIN HCL 1,000 MG TABLET] 180 tablet 0    Sig: TAKE 1 TAB BY MOUTH 2 TIMES DAILY WITH A MEAL. PLEASE SCHEDULE OFFICE VISIT BEFORE ANY FUTURE REFILL     Endocrinology:  Diabetes - Biguanides Failed - 12/10/2022  2:29 AM      Failed - HBA1C is between 0 and 7.9 and within 180 days    Hgb A1c MFr Bld  Date Value Ref Range Status  04/02/2022 9.4 (H) 4.8 - 5.6 % Final    Comment:             Prediabetes: 5.7 - 6.4          Diabetes: >6.4          Glycemic control for adults with diabetes: <7.0          Failed - B12 Level in normal range and within 720 days    No results found for: "VITAMINB12"       Failed - Valid encounter within last 6 months    Recent Outpatient Visits           8 months ago Annual physical exam   [place] Eulas Post, MD   1 year ago Type 2 diabetes mellitus without complication, without long-term current use of insulin ([place])   [place] Eulas Post, MD   1 year ago Preop examination   [place] Eulas Post, MD   1 year ago Type 2 diabetes mellitus without complication, without long-term current use of insulin ([place])   [place] Eulas Post, MD   2 years ago Type 2 diabetes mellitus without complication, without long-term current use of insulin ([place])   [place] Eulas Post, MD              Passed - Cr in normal range and within 360 days    Creatinine, Ser  Date Value Ref Range  Status  05/13/2022 0.59 0.44 - 1.00 mg/dL Final         Passed - eGFR in normal range and within 360 days    GFR calc Af Amer  Date Value Ref Range Status  11/21/2019 105 >59 mL/min/1.73 Final   GFR, Estimated  Date Value Ref Range Status  05/13/2022 >60 >60 mL/min Final    Comment:    (NOTE) Calculated using the CKD-EPI Creatinine Equation (2021)    eGFR  Date Value Ref Range Status  04/02/2022 97 >59 mL/min/1.73 Final         Passed - CBC within normal limits and completed in the last 12 months    WBC  Date Value Ref Range Status  05/13/2022 11.7 (H) 4.0 - 10.5 K/uL Final   RBC  Date Value Ref Range Status  05/13/2022 4.22 3.87 - 5.11 MIL/uL Final   Hemoglobin  Date Value Ref Range Status  05/13/2022 11.0 (L) 12.0 - 15.0 g/dL Final  04/02/2022 10.6 (L) 11.1 - 15.9 g/dL Final   HCT  Date Value Ref  Range Status  05/13/2022 34.4 (L) 36.0 - 46.0 % Final   Hematocrit  Date Value Ref Range Status  04/02/2022 32.9 (L) 34.0 - 46.6 % Final   MCHC  Date Value Ref Range Status  05/13/2022 32.0 30.0 - 36.0 g/dL Final   [facility]  Date Value Ref Range Status  05/13/2022 26.1 26.0 - 34.0 pg Final   MCV  Date Value Ref Range Status  05/13/2022 81.5 80.0 - 100.0 fL Final  04/02/2022 80 79 - 97 fL Final   No results found for: "PLTCOUNTKUC", "LABPLAT", "POCPLA" RDW  Date Value Ref Range Status  05/13/2022 14.9 11.5 - 15.5 % Final  04/02/2022 15.4 11.7 - 15.4 % Final

## 2022-12-13 ENCOUNTER — Ambulatory Visit (INDEPENDENT_AMBULATORY_CARE_PROVIDER_SITE_OTHER): Payer: Medicare HMO | Admitting: Physician Assistant

## 2022-12-13 ENCOUNTER — Encounter: Payer: Self-pay | Admitting: Physician Assistant

## 2022-12-13 VITALS — BP 171/67 | HR 79 | Temp 98.1°F

## 2022-12-13 DIAGNOSIS — R42 Dizziness and giddiness: Secondary | ICD-10-CM

## 2022-12-13 DIAGNOSIS — H6500 Acute serous otitis media, unspecified ear: Secondary | ICD-10-CM

## 2022-12-13 DIAGNOSIS — R110 Nausea: Secondary | ICD-10-CM | POA: Diagnosis not present

## 2022-12-13 MED ORDER — MECLIZINE HCL 25 MG PO CHEW: 25.0000 mg | CHEWABLE_TABLET | Freq: Every day | ORAL | 0 refills | Status: AC

## 2022-12-13 MED ORDER — PROMETHAZINE HCL 25 MG RE SUPP: 25.0000 mg | Freq: Four times a day (QID) | RECTAL | 0 refills | Status: DC | PRN

## 2022-12-13 MED ORDER — PREDNISONE 20 MG PO TABS: 20.0000 mg | ORAL_TABLET | Freq: Two times a day (BID) | ORAL | 0 refills | Status: AC

## 2022-12-13 MED ORDER — FLUTICASONE PROPIONATE 50 MCG/ACT NA SUSP: 2.0000 | Freq: Every day | NASAL | 6 refills | Status: AC

## 2022-12-13 NOTE — Progress Notes (Signed)
Established patient visit   [name]   DOB: 08/08/1956   67 y.o. Female  MRN: [id number] Visit Date: 12/13/2022  Today's healthcare provider: Mardene Speak, PA-C   No chief complaint on file.  Subjective    HPI  Patient is a 67 year old female who presents with inner ear problems.  She states that she has been seen by ENT but they were unable to see her today.  Medications: Outpatient Medications Prior to Visit  Medication Sig   docusate sodium (COLACE) 100 MG capsule Take 1 capsule (100 mg total) by mouth 2 (two) times daily.   glucose blood (ACCU-CHEK COMPACT STRIPS) test strip Check sugar once daily-need QS 30 day supply. DX E11.9   Lancet Device MISC One Touch Ultra Mini Lancets   Lancets (ONETOUCH DELICA PLUS 123XX123) MISC See admin instructions.   metFORMIN (GLUCOPHAGE) 1000 MG tablet Take 1 tablet (1,000 mg total) by mouth 2 (two) times daily with a meal. Please schedule office visit before any future refill.   omeprazole (PRILOSEC) 40 MG capsule TAKE 1 CAPSULE BY MOUTH EVERY DAY   pioglitazone (ACTOS) 30 MG tablet Take 1 tablet (30 mg total) by mouth daily. Please make an appointment in the office with a new provider   rivaroxaban (XARELTO) 10 MG TABS tablet Take 1 tablet (10 mg total) by mouth daily with breakfast.   valsartan (DIOVAN) 40 MG tablet Take 1 tablet (40 mg total) by mouth daily.   Verapamil HCl CR 300 MG CP24 Take 1 capsule (300 mg total) by mouth daily. Please make an appointment in the office with a new provider   No facility-administered medications prior to visit.    Review of Systems  HENT:  Positive for congestion, ear pain, postnasal drip and rhinorrhea. Negative for sinus pressure and sinus pain.   Gastrointestinal:  Positive for vomiting.  Neurological:  Positive for dizziness and light-headedness.       Objective    BP (!) 171/67 (BP Location: Left Arm, Patient Position: Sitting, Cuff Size: Normal)   Pulse 79   Temp  98.1 F (36.7 C) (Oral)   SpO2 99%    Physical Exam Vitals reviewed.  Constitutional:      General: She is in acute distress.     Appearance: Normal appearance. She is well-developed. She is not diaphoretic.  HENT:     Head: Normocephalic and atraumatic.     Right Ear: Ear canal and external ear normal. There is impacted cerumen.     Left Ear: Ear canal and external ear normal. There is impacted cerumen.     Nose: Congestion (very mild) and rhinorrhea present.     Mouth/Throat:     Pharynx: Posterior oropharyngeal erythema (mild) present.     Comments: Postnasal drainage Eyes:     General: No scleral icterus.       Right eye: No discharge.        Left eye: No discharge.     Extraocular Movements: Extraocular movements intact.     Conjunctiva/sclera: Conjunctivae normal.     Pupils: Pupils are equal, round, and reactive to light.  Neck:     Thyroid: No thyromegaly.  Cardiovascular:     Rate and Rhythm: Normal rate and regular rhythm.     Pulses: Normal pulses.     Heart sounds: Normal heart sounds. No murmur heard. Pulmonary:     Effort: Pulmonary effort is normal. No respiratory distress.     Breath  sounds: Normal breath sounds. No wheezing, rhonchi or rales.  Musculoskeletal:     Cervical back: Neck supple.     Right lower leg: No edema.     Left lower leg: No edema.  Lymphadenopathy:     Cervical: No cervical adenopathy.  Skin:    General: Skin is warm and dry.     Findings: No rash.  Neurological:     Mental Status: She is alert and oriented to person, place, and time. Mental status is at baseline.     Cranial Nerves: No cranial nerve deficit.     Sensory: No sensory deficit.     Motor: No weakness.     Coordination: Coordination abnormal (due to dizziness).     Gait: Gait abnormal.  Psychiatric:        Behavior: Behavior normal.        Thought Content: Thought content normal.        Judgment: Judgment normal.   Pt feels nauseous and decline to be placed on a  table. Ambulates with a cane  No results found for any visits on 12/13/22.  Assessment & Plan     1. Nausea 2. Dizziness 3. 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  Use distilled water.   - Use Flonase 2 sprays each nostril daily. Aim upward and outward. - Use Zyrtec 10 mg daily.  - Consider allergy shots as long term control of your symptoms by teaching your immune system to be more tolerant of your allergy triggers - If vertigo like symptoms persist despite treatment for your allergies as discussed above, discuss with PCP further as that is likely not the cause and might need to see ENT.   - fluticasone (FLONASE) 50 MCG/ACT nasal spray; Place 2 sprays into both nostrils daily.  Dispense: 16 g; Refill: 6 - predniSONE (DELTASONE) 20 MG tablet; Take 1 tablet (20 mg total) by mouth 2 (two) times daily with a meal.  Dispense: 10 tablet; Refill: 0 Start with 20 mg daily and adjust a dose if needed. - Meclizine HCl 25 MG CHEW; Chew 1 tablet (25 mg total) by mouth daily.  Dispense: 90 tablet; Refill: 0 Or if pt cannot tolerate meclizine: - promethazine (PHENERGAN) 25 MG suppository; Place 1 suppository (25 mg total) rectally every 6 (six) hours as needed for nausea or vomiting.  Dispense: 12 each; Refill: 0  Advised to drink plenty of water, liquid  Will refer to PT if patient agrees for vestibular rehabilitation exercise. Lateral canal BPPV may respond to barbecue roll maneuver. Vestibular rehabilitation exercises: ball toss, lying-to-standing, target-change, thumb-tracking, tightrope, walking turns  The patient was advised to call back or seek an in-person evaluation if the  symptoms worsen or if the condition fails to improve as anticipated  Recommended to schedule and FU with ENT Pt reassured me that her DM II is under control. She needs a fu for her chronic conditions.  No follow-ups on file.     I discussed the assessment and treatment plan with the patient. The patient was provided an opportunity to ask questions and all were answered. The patient agreed with the plan and demonstrated an understanding of the instructions.  I, Mardene Speak, PA-C  have reviewed all documentation for this visit. The documentation on 12/13/22 for the exam, diagnosis, procedures, and orders are all accurate and complete.  Mardene Speak, [facility], [place] [phone] (phone) [phone] (fax)   [place]

## 2022-12-14 ENCOUNTER — Telehealth: Payer: Self-pay

## 2022-12-14 NOTE — Telephone Encounter (Unsigned)
Copied from [place] [id number]. Topic: General - Other >> Dec 14, 2022 12:16 PM Everette C wrote: Reason for CRM: The patient has been directed by their pharmacy to contact their PCP and request additional authorization for the prescription promethazine (PHENERGAN) 25 MG suppository [id number]  [facility] [place] [address] [place] [address] Phone: [phone] Fax: [phone] Hours: Not open 24 hours  The patient's pharmacy would like to be contacted by Prov. Ostwalt when possible

## 2022-12-15 ENCOUNTER — Telehealth: Payer: Self-pay | Admitting: Physician Assistant

## 2022-12-15 ENCOUNTER — Other Ambulatory Visit: Payer: Self-pay | Admitting: Physician Assistant

## 2022-12-15 DIAGNOSIS — R110 Nausea: Secondary | ICD-10-CM

## 2022-12-15 MED ORDER — PROMETHAZINE HCL 12.5 MG RE SUPP: 12.5000 mg | Freq: Four times a day (QID) | RECTAL | 0 refills | Status: AC | PRN

## 2022-12-15 NOTE — Telephone Encounter (Signed)
Patient advised and reports she does want the suppositories

## 2022-12-15 NOTE — Telephone Encounter (Signed)
Covermymeds requesting prior authorization Key: [id number] Name: [place] Promethazine HCI 25MG  Suppositories

## 2022-12-15 NOTE — Telephone Encounter (Signed)
[facility] requesting prior authorization Key: [id number] Name: [place] Promethegan 12.5 MG suppositories

## 2022-12-15 NOTE — Telephone Encounter (Signed)
Lenard Lance (KeyMaryjane Hurter) [id number] Promethazine HCl 25MG  suppositories Status: PA RequestCreated: April 1st, 2024 [id number] Sent: April 3rd, 2024

## 2022-12-15 NOTE — Telephone Encounter (Signed)
Sarah Carson (Key: [id number]) Promethegan 12.5MG  suppositories Status: Question Response - N/ACreated: April 3rd, 2024 [phone]

## 2022-12-15 NOTE — Telephone Encounter (Signed)
Duplicate request. Archived today

## 2022-12-24 ENCOUNTER — Other Ambulatory Visit: Payer: Self-pay | Admitting: Family Medicine

## 2022-12-24 DIAGNOSIS — E119 Type 2 diabetes mellitus without complications: Secondary | ICD-10-CM

## 2022-12-28 ENCOUNTER — Telehealth: Payer: Self-pay | Admitting: *Deleted

## 2022-12-28 NOTE — Telephone Encounter (Signed)
Spoke to pt--verified still having dizziness. Pt aware that phenergan is not covered by insurance. Pt will call the office back with info- for med that will cover. Pt requesting refill with prednisone if possible. Please advise

## 2023-01-18 ENCOUNTER — Other Ambulatory Visit: Payer: Self-pay | Admitting: Family Medicine

## 2023-01-18 DIAGNOSIS — Z1231 Encounter for screening mammogram for malignant neoplasm of breast: Secondary | ICD-10-CM

## 2023-03-02 ENCOUNTER — Telehealth: Payer: Self-pay | Admitting: Physician Assistant

## 2023-03-02 NOTE — Telephone Encounter (Signed)
Contacted Sarah Carson to schedule their annual wellness visit. Patient declined to schedule AWV at this time.  I spoke to patient and [facility]  Thank you,  [facility] Support [facility] Direct dial  [phone]

## 2023-03-19 ENCOUNTER — Other Ambulatory Visit: Payer: Self-pay | Admitting: Physician Assistant

## 2023-03-19 DIAGNOSIS — E119 Type 2 diabetes mellitus without complications: Secondary | ICD-10-CM

## 2023-03-21 NOTE — Telephone Encounter (Signed)
Requested Prescriptions  Refused Prescriptions Disp Refills   metFORMIN (GLUCOPHAGE) 1000 MG tablet [Pharmacy Med Name: METFORMIN HCL 1,000 MG TABLET] 180 tablet 0    Sig: TAKE 1 TAB BY MOUTH 2 TIMES DAILY WITH A MEAL. PLEASE SCHEDULE OFFICE VISIT BEFORE ANY FUTURE REFILL     Endocrinology:  Diabetes - Biguanides Failed - 03/19/2023  9:24 AM      Failed - HBA1C is between 0 and 7.9 and within 180 days    Hgb A1c MFr Bld  Date Value Ref Range Status  04/02/2022 9.4 (H) 4.8 - 5.6 % Final    Comment:             Prediabetes: 5.7 - 6.4          Diabetes: >6.4          Glycemic control for adults with diabetes: <7.0          Failed - B12 Level in normal range and within 720 days    No results found for: "VITAMINB12"       Passed - Cr in normal range and within 360 days    Creatinine, Ser  Date Value Ref Range Status  05/13/2022 0.59 0.44 - 1.00 mg/dL Final         Passed - eGFR in normal range and within 360 days    GFR calc Af Amer  Date Value Ref Range Status  11/21/2019 105 >59 mL/min/1.73 Final   GFR, Estimated  Date Value Ref Range Status  05/13/2022 >60 >60 mL/min Final    Comment:    (NOTE) Calculated using the CKD-EPI Creatinine Equation (2021)    eGFR  Date Value Ref Range Status  04/02/2022 97 >59 mL/min/1.73 Final         Passed - Valid encounter within last 6 months    Recent Outpatient Visits           3 months ago Nausea   [place] [facility], [place], PA-C   11 months ago Annual physical exam   [facility] Bosie Clos, MD   1 year ago Type 2 diabetes mellitus without complication, without long-term current use of insulin (HCC)   [place] [facility] Bosie Clos, MD   1 year ago Preop examination   [facility] Bosie Clos, MD   1 year ago Type 2 diabetes mellitus without complication, without long-term current use of insulin (HCC)    [place] [facility] Bosie Clos, MD              Passed - CBC within normal limits and completed in the last 12 months    WBC  Date Value Ref Range Status  05/13/2022 11.7 (H) 4.0 - 10.5 K/uL Final   RBC  Date Value Ref Range Status  05/13/2022 4.22 3.87 - 5.11 MIL/uL Final   Hemoglobin  Date Value Ref Range Status  05/13/2022 11.0 (L) 12.0 - 15.0 g/dL Final  16/06/9603 54.0 (L) 11.1 - 15.9 g/dL Final   HCT  Date Value Ref Range Status  05/13/2022 34.4 (L) 36.0 - 46.0 % Final   Hematocrit  Date Value Ref Range Status  04/02/2022 32.9 (L) 34.0 - 46.6 % Final   MCHC  Date Value Ref Range Status  05/13/2022 32.0 30.0 - 36.0 g/dL Final   [facility]  Date Value Ref Range Status  05/13/2022 26.1 26.0 - 34.0 pg Final   MCV  Date Value Ref Range Status  05/13/2022 81.5 80.0 - 100.0 fL Final  04/02/2022 80 79 - 97 fL Final   No results found for: "PLTCOUNTKUC", "LABPLAT", "POCPLA" RDW  Date Value Ref Range Status  05/13/2022 14.9 11.5 - 15.5 % Final  04/02/2022 15.4 11.7 - 15.4 % Final

## 2023-04-15 ENCOUNTER — Other Ambulatory Visit: Payer: Self-pay | Admitting: Physician Assistant

## 2023-04-15 DIAGNOSIS — E119 Type 2 diabetes mellitus without complications: Secondary | ICD-10-CM

## 2023-04-15 NOTE — Telephone Encounter (Signed)
Patient no longer a patient at [facility]. PCP is Dr. Sullivan Lone.  Requested Prescriptions  Pending Prescriptions Disp Refills   metFORMIN (GLUCOPHAGE) 1000 MG tablet [Pharmacy Med Name: METFORMIN HCL 1,000 MG TABLET] 180 tablet 0    Sig: TAKE 1 TAB BY MOUTH 2 TIMES DAILY WITH A MEAL. PLEASE SCHEDULE OFFICE VISIT BEFORE ANY FUTURE REFILL     Endocrinology:  Diabetes - Biguanides Failed - 04/15/2023  2:36 AM      Failed - HBA1C is between 0 and 7.9 and within 180 days    Hgb A1c MFr Bld  Date Value Ref Range Status  04/02/2022 9.4 (H) 4.8 - 5.6 % Final    Comment:             Prediabetes: 5.7 - 6.4          Diabetes: >6.4          Glycemic control for adults with diabetes: <7.0          Failed - B12 Level in normal range and within 720 days    No results found for: "VITAMINB12"       Failed - CBC within normal limits and completed in the last 12 months    WBC  Date Value Ref Range Status  05/13/2022 11.7 (H) 4.0 - 10.5 K/uL Final   RBC  Date Value Ref Range Status  05/13/2022 4.22 3.87 - 5.11 MIL/uL Final   Hemoglobin  Date Value Ref Range Status  05/13/2022 11.0 (L) 12.0 - 15.0 g/dL Final  57/84/6962 95.2 (L) 11.1 - 15.9 g/dL Final   HCT  Date Value Ref Range Status  05/13/2022 34.4 (L) 36.0 - 46.0 % Final   Hematocrit  Date Value Ref Range Status  04/02/2022 32.9 (L) 34.0 - 46.6 % Final   MCHC  Date Value Ref Range Status  05/13/2022 32.0 30.0 - 36.0 g/dL Final   [facility]  Date Value Ref Range Status  05/13/2022 26.1 26.0 - 34.0 pg Final   MCV  Date Value Ref Range Status  05/13/2022 81.5 80.0 - 100.0 fL Final  04/02/2022 80 79 - 97 fL Final   No results found for: "PLTCOUNTKUC", "LABPLAT", "POCPLA" RDW  Date Value Ref Range Status  05/13/2022 14.9 11.5 - 15.5 % Final  04/02/2022 15.4 11.7 - 15.4 % Final         Passed - Cr in normal range and within 360 days    Creatinine, Ser  Date Value Ref Range Status  05/13/2022 0.59 0.44 - 1.00 mg/dL Final         Passed  - eGFR in normal range and within 360 days    GFR calc Af Amer  Date Value Ref Range Status  11/21/2019 105 >59 mL/min/1.73 Final   GFR, Estimated  Date Value Ref Range Status  05/13/2022 >60 >60 mL/min Final    Comment:    (NOTE) Calculated using the CKD-EPI Creatinine Equation (2021)    eGFR  Date Value Ref Range Status  04/02/2022 97 >59 mL/min/1.73 Final         Passed - Valid encounter within last 6 months    Recent Outpatient Visits           4 months ago Nausea   [place] [facility] [place], [place], PA-C   1 year ago Annual physical exam   [place] [facility] Bosie Clos, MD   1 year ago Type 2 diabetes mellitus without complication, without long-term current use  of insulin [facility])   [place] [facility] Bosie Clos, MD   1 year ago Preop examination   [place] [facility] Bosie Clos, MD   2 years ago Type 2 diabetes mellitus without complication, without long-term current use of insulin [facility])   [place] [facility] Bosie Clos, MD

## 2023-04-25 ENCOUNTER — Other Ambulatory Visit: Payer: Self-pay | Admitting: Physician Assistant

## 2023-04-25 DIAGNOSIS — E119 Type 2 diabetes mellitus without complications: Secondary | ICD-10-CM

## 2023-04-26 ENCOUNTER — Other Ambulatory Visit: Payer: Self-pay | Admitting: Family Medicine

## 2023-04-26 DIAGNOSIS — I10 Essential (primary) hypertension: Secondary | ICD-10-CM

## 2023-04-26 NOTE — Telephone Encounter (Signed)
Requested medications are due for refill today.  yes  Requested medications are on the active medications list.  yes  Last refill. 12/24/2022 #180 0 rf  Future visit scheduled.   no  Notes to clinic.  Labs are expired.    Requested Prescriptions  Pending Prescriptions Disp Refills   metFORMIN (GLUCOPHAGE) 1000 MG tablet [Pharmacy Med Name: METFORMIN HCL 1,000 MG TABLET] 180 tablet 0    Sig: TAKE 1 TAB BY MOUTH 2 TIMES DAILY WITH A MEAL. PLEASE SCHEDULE OFFICE VISIT BEFORE ANY FUTURE REFILL     Endocrinology:  Diabetes - Biguanides Failed - 04/25/2023  7:39 AM      Failed - HBA1C is between 0 and 7.9 and within 180 days    Hgb A1c MFr Bld  Date Value Ref Range Status  04/02/2022 9.4 (H) 4.8 - 5.6 % Final    Comment:             Prediabetes: 5.7 - 6.4          Diabetes: >6.4          Glycemic control for adults with diabetes: <7.0          Failed - B12 Level in normal range and within 720 days    No results found for: "VITAMINB12"       Failed - CBC within normal limits and completed in the last 12 months    WBC  Date Value Ref Range Status  05/13/2022 11.7 (H) 4.0 - 10.5 K/uL Final   RBC  Date Value Ref Range Status  05/13/2022 4.22 3.87 - 5.11 MIL/uL Final   Hemoglobin  Date Value Ref Range Status  05/13/2022 11.0 (L) 12.0 - 15.0 g/dL Final  43/32/9518 84.1 (L) 11.1 - 15.9 g/dL Final   HCT  Date Value Ref Range Status  05/13/2022 34.4 (L) 36.0 - 46.0 % Final   Hematocrit  Date Value Ref Range Status  04/02/2022 32.9 (L) 34.0 - 46.6 % Final   MCHC  Date Value Ref Range Status  05/13/2022 32.0 30.0 - 36.0 g/dL Final   [facility]  Date Value Ref Range Status  05/13/2022 26.1 26.0 - 34.0 pg Final   MCV  Date Value Ref Range Status  05/13/2022 81.5 80.0 - 100.0 fL Final  04/02/2022 80 79 - 97 fL Final   No results found for: "PLTCOUNTKUC", "LABPLAT", "POCPLA" RDW  Date Value Ref Range Status  05/13/2022 14.9 11.5 - 15.5 % Final  04/02/2022 15.4 11.7 - 15.4 %  Final         Passed - Cr in normal range and within 360 days    Creatinine, Ser  Date Value Ref Range Status  05/13/2022 0.59 0.44 - 1.00 mg/dL Final         Passed - eGFR in normal range and within 360 days    GFR calc Af Amer  Date Value Ref Range Status  11/21/2019 105 >59 mL/min/1.73 Final   GFR, Estimated  Date Value Ref Range Status  05/13/2022 >60 >60 mL/min Final    Comment:    (NOTE) Calculated using the CKD-EPI Creatinine Equation (2021)    eGFR  Date Value Ref Range Status  04/02/2022 97 >59 mL/min/1.73 Final         Passed - Valid encounter within last 6 months    Recent Outpatient Visits           4 months ago Nausea   [facility] [place], [place], [place]  1 year ago Annual physical exam   [facility] Bosie Clos, [place]   1 year ago Type 2 diabetes mellitus without complication, without long-term current use of insulin [facility])   [place] [facility] Bosie Clos, [place]   1 year ago Preop examination   [place] [facility] Bosie Clos, [facility]   2 years ago Type 2 diabetes mellitus without complication, without long-term current use of insulin [facility])   [place] [facility] Bosie Clos, MD

## 2023-05-14 ENCOUNTER — Other Ambulatory Visit: Payer: Self-pay | Admitting: Physician Assistant

## 2023-05-14 DIAGNOSIS — E119 Type 2 diabetes mellitus without complications: Secondary | ICD-10-CM

## 2023-05-17 NOTE — Telephone Encounter (Signed)
Requested medication (s) are due for refill today: na   Requested medication (s) are on the active medication list: yes   Last refill:  12/24/22 #180 0 refills   Future visit scheduled: no   Notes to clinic:  protocol failed last labs 04/02/22. Last ordered by J. Oswalt, PA 12/24/22. Last OV shows 01/14/23 with Dr. Sullivan Lone at [facility]. Do you want to refill Rx?     Requested Prescriptions  Pending Prescriptions Disp Refills   metFORMIN (GLUCOPHAGE) 1000 MG tablet [Pharmacy Med Name: METFORMIN HCL 1,000 MG TABLET] 180 tablet 0    Sig: TAKE 1 TAB BY MOUTH 2 TIMES DAILY WITH A MEAL. PLEASE SCHEDULE OFFICE VISIT BEFORE ANY FUTURE REFILL     Endocrinology:  Diabetes - Biguanides Failed - 05/14/2023  8:22 AM      Failed - Cr in normal range and within 360 days    Creatinine, Ser  Date Value Ref Range Status  05/13/2022 0.59 0.44 - 1.00 mg/dL Final         Failed - HBA1C is between 0 and 7.9 and within 180 days    Hgb A1c MFr Bld  Date Value Ref Range Status  04/02/2022 9.4 (H) 4.8 - 5.6 % Final    Comment:             Prediabetes: 5.7 - 6.4          Diabetes: >6.4          Glycemic control for adults with diabetes: <7.0          Failed - eGFR in normal range and within 360 days    GFR calc Af Amer  Date Value Ref Range Status  11/21/2019 105 >59 mL/min/1.73 Final   GFR, Estimated  Date Value Ref Range Status  05/13/2022 >60 >60 mL/min Final    Comment:    (NOTE) Calculated using the CKD-EPI Creatinine Equation (2021)    eGFR  Date Value Ref Range Status  04/02/2022 97 >59 mL/min/1.73 Final         Failed - B12 Level in normal range and within 720 days    No results found for: "VITAMINB12"       Failed - CBC within normal limits and completed in the last 12 months    WBC  Date Value Ref Range Status  05/13/2022 11.7 (H) 4.0 - 10.5 K/uL Final   RBC  Date Value Ref Range Status  05/13/2022 4.22 3.87 - 5.11 MIL/uL Final   Hemoglobin  Date Value Ref Range  Status  05/13/2022 11.0 (L) 12.0 - 15.0 g/dL Final  40/98/1191 47.8 (L) 11.1 - 15.9 g/dL Final   HCT  Date Value Ref Range Status  05/13/2022 34.4 (L) 36.0 - 46.0 % Final   Hematocrit  Date Value Ref Range Status  04/02/2022 32.9 (L) 34.0 - 46.6 % Final   MCHC  Date Value Ref Range Status  05/13/2022 32.0 30.0 - 36.0 g/dL Final   [facility]  Date Value Ref Range Status  05/13/2022 26.1 26.0 - 34.0 pg Final   MCV  Date Value Ref Range Status  05/13/2022 81.5 80.0 - 100.0 fL Final  04/02/2022 80 79 - 97 fL Final   No results found for: "PLTCOUNTKUC", "LABPLAT", "POCPLA" RDW  Date Value Ref Range Status  05/13/2022 14.9 11.5 - 15.5 % Final  04/02/2022 15.4 11.7 - 15.4 % Final         Passed - Valid encounter within last 6 months  Recent Outpatient Visits           5 months ago Nausea   [place] [facility] [place], [place], [place]   1 year ago Annual physical exam   [facility] Bosie Clos, [facility]   1 year ago Type 2 diabetes mellitus without complication, without long-term current use of insulin [facility] [place])   [place] [facility] Bos[facility] Clos, [facility]   1 year ago Preop examination   [place] [facility] Bosie Clos, MD   2 years ago Type 2 diabetes mellitus without complication, without long-term current use of insulin [facility])   [place] [facility] Bosie Clos, MD

## 2023-05-17 NOTE — Telephone Encounter (Signed)
Called patient to review medication refills. In review of chart , patient last OV with Dr. Sullivan Lone on 01/14/23 at [facility] clinic. Called to verify if patient will be seen at different practice. No answer, LVMTCB

## 2023-05-27 ENCOUNTER — Other Ambulatory Visit: Payer: Self-pay | Admitting: Physician Assistant

## 2023-05-27 DIAGNOSIS — E119 Type 2 diabetes mellitus without complications: Secondary | ICD-10-CM

## 2023-06-10 ENCOUNTER — Other Ambulatory Visit: Payer: Self-pay | Admitting: Physician Assistant

## 2023-06-10 DIAGNOSIS — E119 Type 2 diabetes mellitus without complications: Secondary | ICD-10-CM

## 2023-09-30 ENCOUNTER — Telehealth: Payer: Self-pay | Admitting: Family Medicine

## 2023-09-30 NOTE — Telephone Encounter (Signed)
Opened in error

## 2023-10-19 ENCOUNTER — Other Ambulatory Visit: Payer: Self-pay | Admitting: Family Medicine

## 2023-10-19 DIAGNOSIS — I10 Essential (primary) hypertension: Secondary | ICD-10-CM

## 2024-01-24 ENCOUNTER — Other Ambulatory Visit: Payer: Self-pay | Admitting: Family Medicine

## 2024-01-24 DIAGNOSIS — Z1231 Encounter for screening mammogram for malignant neoplasm of breast: Secondary | ICD-10-CM
